# Patient Record
Sex: Female | Born: 1985 | Race: Black or African American | Hispanic: No | Smoking: Never smoker
Health system: Southern US, Community
[De-identification: ages and names within clinical notes are randomized; demographics above are authoritative.]

## PROBLEM LIST (undated history)

## (undated) DIAGNOSIS — N83209 Unspecified ovarian cyst, unspecified side: Secondary | ICD-10-CM

## (undated) DIAGNOSIS — N840 Polyp of corpus uteri: Secondary | ICD-10-CM

## (undated) DIAGNOSIS — T7840XA Allergy, unspecified, initial encounter: Secondary | ICD-10-CM

## (undated) DIAGNOSIS — D649 Anemia, unspecified: Secondary | ICD-10-CM

## (undated) DIAGNOSIS — Z5189 Encounter for other specified aftercare: Secondary | ICD-10-CM

## (undated) DIAGNOSIS — N809 Endometriosis, unspecified: Secondary | ICD-10-CM

## (undated) DIAGNOSIS — F419 Anxiety disorder, unspecified: Secondary | ICD-10-CM

## (undated) HISTORY — DX: Allergy, unspecified, initial encounter: T78.40XA

## (undated) HISTORY — DX: Polyp of corpus uteri: N84.0

## (undated) HISTORY — DX: Encounter for other specified aftercare: Z51.89

## (undated) HISTORY — DX: Anxiety disorder, unspecified: F41.9

## (undated) HISTORY — PX: OTHER SURGICAL HISTORY: SHX169

## (undated) HISTORY — PX: LAPAROSCOPIC OVARIAN CYSTECTOMY: SUR786

---
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
## 2007-03-04 ENCOUNTER — Observation Stay (HOSPITAL_COMMUNITY): Admission: AD | Admit: 2007-03-04 | Discharge: 2007-03-05 | Payer: Self-pay | Admitting: Internal Medicine

## 2009-07-08 ENCOUNTER — Emergency Department (HOSPITAL_COMMUNITY): Admission: EM | Admit: 2009-07-08 | Discharge: 2009-07-08 | Payer: Self-pay | Admitting: Emergency Medicine

## 2010-09-16 NOTE — Discharge Summary (Signed)
Margaret Roach, [place]            ACCOUNT NO.:  [id number]   MEDICAL RECORD NO.:  [id number]          PATIENT TYPE:  OBV   LOCATION:  1345                         FACILITY:  [facility]   PHYSICIAN:  Jackie Plum, M.D.DATE OF BIRTH:  09/24/1985   DATE OF ADMISSION:  03/04/2007  DATE OF DISCHARGE:  03/05/2007                               DISCHARGE SUMMARY   DISCHARGE DIAGNOSIS:  Dysfunctional uterine bleeding with blood loss  anemia/iron deficiency anemia, symptomatic, status post three units  packed red blood cell transfusion this admission, improved.   DISCHARGE MEDICATIONS:  Patient is going to continue her iron 150 mg  b.i.d.   FOLLOWUP APPOINTMENT:  Patient has a followup with Dr. Julio Sicks in the  last week of November and Dr. Neva Seat , of Gynecology, on March 16, 2007.   CONDITION ON DISCHARGE:  Improved, satisfactory.   REASON FOR ADMISSION:  Symptomatic severe anemia.   HPI:  Patient is a 25 year old lady with a history of dysfunctional  uterine bleeding who was seen in the office and was started on iron and  Provera and given an appointment to follow up with Gynecology.  However,  she continued to complain of dizziness with dyspnea on exertion which  was symptomatic of her severe anemia with hemoglobin value of 7.4.  She  was therefore admitted for blood transfusion.  She received a blood  transfusion at [facility] over night.  She is feeling better  this morning, she is hemodynamically stable, her symptoms have resolved.  No more dizziness or weakness.  No dyspnea on exertion and she has a  hemoglobin more than 10 and she is being discharged today with  outpatient followup by Dr. Neva Seat, who has already got an appointment  for her to be seen on November the 12th.      Jackie Plum, M.D.  Electronically Signed     GO/MEDQ  D:  03/05/2007  T:  03/06/2007  Job:  [id number]
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
## 2011-02-10 LAB — CBC
HCT: 38.1
Hemoglobin: 12.4
MCHC: 32.6
Platelets: 460 — ABNORMAL HIGH
RBC: 5.42 — ABNORMAL HIGH
RDW: 27.9 — ABNORMAL HIGH
WBC: 7.7

## 2011-02-10 LAB — BASIC METABOLIC PANEL
BUN: 5 — ABNORMAL LOW
Calcium: 9.7
Chloride: 101
Potassium: 3.9

## 2011-02-11 LAB — ABO/RH: ABO/RH(D): B POS

## 2011-02-11 LAB — CROSSMATCH: ABO/RH(D): B POS

## 2013-02-24 ENCOUNTER — Encounter (HOSPITAL_BASED_OUTPATIENT_CLINIC_OR_DEPARTMENT_OTHER): Payer: Self-pay | Admitting: Emergency Medicine

## 2013-02-24 ENCOUNTER — Emergency Department (HOSPITAL_BASED_OUTPATIENT_CLINIC_OR_DEPARTMENT_OTHER)
Admission: EM | Admit: 2013-02-24 | Discharge: 2013-02-24 | Disposition: A | Discharge status: Home / Self Care | Payer: 59 | Attending: Emergency Medicine | Admitting: Emergency Medicine

## 2013-02-24 DIAGNOSIS — Z8742 Personal history of other diseases of the female genital tract: Secondary | ICD-10-CM | POA: Insufficient documentation

## 2013-02-24 DIAGNOSIS — L509 Urticaria, unspecified: Secondary | ICD-10-CM | POA: Insufficient documentation

## 2013-02-24 HISTORY — DX: Unspecified ovarian cyst, unspecified side: N83.209

## 2013-02-24 HISTORY — DX: Endometriosis, unspecified: N80.9

## 2013-02-24 MED ORDER — DEXAMETHASONE 4 MG PO TABS
10.0000 mg | ORAL_TABLET | Freq: Once | ORAL | Status: AC
  Administered 2013-02-24: 10 mg via ORAL

## 2013-02-24 MED ORDER — FAMOTIDINE 20 MG PO TABS
20.0000 mg | ORAL_TABLET | Freq: Once | ORAL | Status: AC
  Administered 2013-02-24: 20 mg via ORAL
  Filled 2013-02-24: qty 1

## 2013-02-24 MED ORDER — DEXAMETHASONE 4 MG PO TABS
ORAL_TABLET | ORAL | Status: AC
  Filled 2013-02-24: qty 3

## 2013-02-24 MED ORDER — DIPHENHYDRAMINE HCL 25 MG PO CAPS
50.0000 mg | ORAL_CAPSULE | Freq: Once | ORAL | Status: AC
  Administered 2013-02-24: 50 mg via ORAL
  Filled 2013-02-24: qty 2

## 2013-02-24 MED ORDER — FAMOTIDINE 20 MG PO TABS: 20.0000 mg | ORAL_TABLET | Freq: Two times a day (BID) | ORAL | Status: DC | PRN

## 2013-02-24 MED ORDER — DIPHENHYDRAMINE HCL 25 MG PO TABS: 50.0000 mg | ORAL_TABLET | Freq: Four times a day (QID) | ORAL | Status: DC | PRN

## 2013-02-24 NOTE — ED Notes (Signed)
Pt given rx x 2 for benadryl and pepcid

## 2013-02-24 NOTE — ED Provider Notes (Signed)
CSN: [id number]     Arrival date & time 02/24/13  0315 History   First MD Initiated Contact with Patient 02/24/13 0329     Chief Complaint  Patient presents with  . Allergic Reaction   (Consider location/radiation/quality/duration/timing/severity/associated sxs/prior Treatment) HPI This is a 27 year old female who developed what she describes as hives on her lower legs 2 days ago. Over the past several hours she has developed diffuse hives. She states they're extremely itchy. She has been scratching them, particularly the ones on her legs. She denies throat swelling, shortness of breath, nausea, vomiting or diarrhea. She has not taken anything for these. She is not sure what may have triggered this. She did use a new body wash this week but that was after the lesions on her legs developed.  Past Medical History  Diagnosis Date  . Endometriosis   . Ovarian cyst    Past Surgical History  Procedure Laterality Date  . Laparoscopic ovarian cystectomy     No family history on file. History  Substance Use Topics  . Smoking status: Never Smoker   . Smokeless tobacco: Never Used  . Alcohol Use: Yes     Comment: social   OB History   Grav Para Term Preterm Abortions TAB SAB Ect Mult Living                 Review of Systems  All other systems reviewed and are negative.    Allergies  Review of patient's allergies indicates no known allergies.  Home Medications  No current outpatient prescriptions on file. BP 129/80  Pulse 66  Temp(Src) 97.8 F (36.6 C) (Oral)  Resp 20  Ht 5\' 3"  (1.6 m)  Wt 172 lb (78.019 kg)  BMI 30.48 kg/m2  SpO2 100%  LMP 02/06/2013  Physical Exam General: Well-developed, well-nourished female in no acute distress; appearance consistent with age of record HENT: normocephalic; atraumatic Eyes: pupils equal, round and reactive to light; extraocular muscles intact Neck: supple Heart: regular rate and rhythm Lungs: clear to auscultation  bilaterally Abdomen: soft; nondistended; nontender Extremities: No deformity; full range of motion Neurologic: Awake, alert and oriented; motor function intact in all extremities and symmetric; no facial droop Skin: Warm and dry; scattered diffuse urticarial lesions, both lower leg showing evidence of scratching Psychiatric: Normal mood and affect    ED Course  Procedures (including critical care time)    MDM      Hanley Seamen, MD 02/24/13 0340

## 2013-02-24 NOTE — ED Notes (Signed)
Pt reports using new body wash this week- noticed rash on legs yesterday- tonight at work noted hives to chest- denies SOB

## 2016-11-19 ENCOUNTER — Emergency Department (HOSPITAL_BASED_OUTPATIENT_CLINIC_OR_DEPARTMENT_OTHER): Payer: Self-pay

## 2016-11-19 ENCOUNTER — Encounter (HOSPITAL_BASED_OUTPATIENT_CLINIC_OR_DEPARTMENT_OTHER): Payer: Self-pay

## 2016-11-19 ENCOUNTER — Emergency Department (HOSPITAL_BASED_OUTPATIENT_CLINIC_OR_DEPARTMENT_OTHER)
Admission: EM | Admit: 2016-11-19 | Discharge: 2016-11-20 | Disposition: A | Discharge status: Home / Self Care | Payer: Self-pay | Attending: Emergency Medicine | Admitting: Emergency Medicine

## 2016-11-19 DIAGNOSIS — D649 Anemia, unspecified: Secondary | ICD-10-CM | POA: Insufficient documentation

## 2016-11-19 DIAGNOSIS — Z79899 Other long term (current) drug therapy: Secondary | ICD-10-CM | POA: Insufficient documentation

## 2016-11-19 DIAGNOSIS — R102 Pelvic and perineal pain: Secondary | ICD-10-CM | POA: Insufficient documentation

## 2016-11-19 DIAGNOSIS — N72 Inflammatory disease of cervix uteri: Secondary | ICD-10-CM | POA: Insufficient documentation

## 2016-11-19 DIAGNOSIS — D509 Iron deficiency anemia, unspecified: Secondary | ICD-10-CM

## 2016-11-19 HISTORY — DX: Anemia, unspecified: D64.9

## 2016-11-19 LAB — CBC WITH DIFFERENTIAL/PLATELET
Basophils Absolute: 0 10*3/uL (ref 0.0–0.1)
Basophils Relative: 0 %
Eosinophils Absolute: 0.1 10*3/uL (ref 0.0–0.7)
Eosinophils Relative: 1 %
HEMATOCRIT: 31.6 % — AB (ref 36.0–46.0)
HEMOGLOBIN: 10.5 g/dL — AB (ref 12.0–15.0)
LYMPHS ABS: 1.4 10*3/uL (ref 0.7–4.0)
LYMPHS PCT: 11 %
MCH: 24.2 pg — AB (ref 26.0–34.0)
MCHC: 33.2 g/dL (ref 30.0–36.0)
MCV: 72.8 fL — AB (ref 78.0–100.0)
MONOS PCT: 6 %
Monocytes Absolute: 0.8 10*3/uL (ref 0.1–1.0)
NEUTROS ABS: 10.2 10*3/uL — AB (ref 1.7–7.7)
NEUTROS PCT: 82 %
Platelets: 246 10*3/uL (ref 150–400)
RBC: 4.34 MIL/uL (ref 3.87–5.11)
RDW: 17.3 % — ABNORMAL HIGH (ref 11.5–15.5)
WBC: 12.4 10*3/uL — ABNORMAL HIGH (ref 4.0–10.5)

## 2016-11-19 LAB — URINALYSIS, ROUTINE W REFLEX MICROSCOPIC
Bilirubin Urine: NEGATIVE
GLUCOSE, UA: NEGATIVE mg/dL
HGB URINE DIPSTICK: NEGATIVE
KETONES UR: NEGATIVE mg/dL
Nitrite: NEGATIVE
PROTEIN: NEGATIVE mg/dL
Specific Gravity, Urine: 1.012 (ref 1.005–1.030)
pH: 8 (ref 5.0–8.0)

## 2016-11-19 LAB — URINALYSIS, MICROSCOPIC (REFLEX): RBC / HPF: NONE SEEN RBC/hpf (ref 0–5)

## 2016-11-19 LAB — WET PREP, GENITAL
SPERM: NONE SEEN
TRICH WET PREP: NONE SEEN
Yeast Wet Prep HPF POC: NONE SEEN

## 2016-11-19 LAB — PREGNANCY, URINE: PREG TEST UR: NEGATIVE

## 2016-11-19 MED ORDER — ONDANSETRON 8 MG PO TBDP
ORAL_TABLET | ORAL | Status: AC
  Filled 2016-11-19: qty 1

## 2016-11-19 MED ORDER — ONDANSETRON 8 MG PO TBDP
8.0000 mg | ORAL_TABLET | Freq: Once | ORAL | Status: AC
  Administered 2016-11-19: 8 mg via ORAL

## 2016-11-19 NOTE — ED Notes (Signed)
Patient transported to Ultrasound 

## 2016-11-19 NOTE — ED Provider Notes (Signed)
[facility] Provider Note   CSN: [id number] Arrival date & time: 11/19/16  1908  By signing my name below, I, Collene Leyden, attest that this documentation has been prepared under the direction and in the presence of Delora Fuel, MD. Electronically Signed: Collene Leyden, Scribe. 11/19/16. 8:28 PM.  History   Chief Complaint Chief Complaint  Patient presents with  . Vaginal Bleeding    HPI Comments: Margaret Roach is a 31 y.o. female with a history of an ovarian cyst and endometriosis, who presents to the Emergency Department complaining of persistent vaginal bleeding that began 9 days ago. Patient reports vaginal bleeding for several days, which progressively worsening 2 days ago. Patient does have a mirena intrauterine device in place (placed 5 years ago). Patient states she is currently on her period, but her menstrual period usually only lasts 7 days and is not as heavy as her current bleeding. Patient reports having to use 2 tampons within a 10 minute time frame.  Patient states she does have a doctors appointment scheduled in one month. Patient reports associated 8/10 abdominal cramping and mild nausea. Patient reports taking ibuprofen without relief. Patient denies any fever, chills, dysuria, hematuria, vaginal discharge, or any additional symptoms.   The history is provided by the patient. No language interpreter was used.    Past Medical History:  Diagnosis Date  . Anemia   . Endometriosis   . Ovarian cyst     There are no active problems to display for this patient.   Past Surgical History:  Procedure Laterality Date  . LAPAROSCOPIC OVARIAN CYSTECTOMY      OB History    No data available       Home Medications    Prior to Admission medications   Medication Sig Start Date End Date Taking? Authorizing Provider  phentermine 37.5 MG capsule Take 37.5 mg by mouth every morning.   Yes [provider]    Family History No family history on  file.  Social History Social History  Substance Use Topics  . Smoking status: Never Smoker  . Smokeless tobacco: Never Used  . Alcohol use Yes     Comment: occ     Allergies   Patient has no known allergies.   Review of Systems Review of Systems  Constitutional: Negative for chills and fever.  Gastrointestinal: Positive for abdominal pain and nausea. Negative for diarrhea and vomiting.  Genitourinary: Positive for vaginal bleeding. Negative for difficulty urinating, dysuria, hematuria and vaginal discharge.  All other systems reviewed and are negative.    Physical Exam Updated Vital Signs BP (!) 136/93 (BP Location: Right Arm)   Pulse 90   Temp 97.8 F (36.6 C) (Oral)   Resp 19   Ht 5\' 3"  (1.6 m)   Wt 168 lb 4.8 oz (76.3 kg)   LMP 11/10/2016   SpO2 100%   BMI 29.81 kg/m   Physical Exam  Constitutional: She is oriented to person, place, and time. She appears well-developed and well-nourished.  HENT:  Head: Normocephalic and atraumatic.  Eyes: Pupils are equal, round, and reactive to light. EOM are normal.  Neck: Normal range of motion. Neck supple. No JVD present.  Cardiovascular: Normal rate, regular rhythm and normal heart sounds.   No murmur heard. Pulmonary/Chest: Effort normal and breath sounds normal. She has no wheezes. She has no rales. She exhibits no tenderness.  Abdominal: Soft. Bowel sounds are normal. She exhibits no distension and no mass. There is no tenderness.  Genitourinary:  Genitourinary Comments: Normal external female genitalia. Moderate amount of white discharge present, no blood in the vaginal vault. Cervix has numerous cysts and is generally friable. There are no adnexal masses or tenderness. Fundus is normal size and position. Marked tenderness on palpation of the cervix.  Musculoskeletal: Normal range of motion. She exhibits no edema.  Lymphadenopathy:    She has no cervical adenopathy.  Neurological: She is alert and oriented to person,  place, and time. No cranial nerve deficit. She exhibits normal muscle tone. Coordination normal.  Skin: Skin is warm and dry. No rash noted.  Psychiatric: She has a normal mood and affect. Her behavior is normal. Judgment and thought content normal.  Nursing note and vitals reviewed.    ED Treatments / Results  DIAGNOSTIC STUDIES: Oxygen Saturation is 100% on RA, normal by my interpretation.    COORDINATION OF CARE: 8:27 PM Discussed treatment plan with pt at bedside and pt agreed to plan, which includes blood work and an STD check.   Labs (all labs ordered are listed, but only abnormal results are displayed) Labs Reviewed  WET PREP, GENITAL - Abnormal; Notable for the following:       Result Value   Clue Cells Wet Prep HPF POC PRESENT (*)    WBC, Wet Prep HPF POC MANY (*)    All other components within normal limits  URINALYSIS, ROUTINE W REFLEX MICROSCOPIC - Abnormal; Notable for the following:    Leukocytes, UA MODERATE (*)    All other components within normal limits  URINALYSIS, MICROSCOPIC (REFLEX) - Abnormal; Notable for the following:    Bacteria, UA RARE (*)    Squamous Epithelial / LPF 0-5 (*)    All other components within normal limits  CBC WITH DIFFERENTIAL/PLATELET - Abnormal; Notable for the following:    WBC 12.4 (*)    Hemoglobin 10.5 (*)    HCT 31.6 (*)    MCV 72.8 (*)    MCH 24.2 (*)    RDW 17.3 (*)    Neutro Abs 10.2 (*)    All other components within normal limits  PREGNANCY, URINE  RPR  HIV ANTIBODY (ROUTINE TESTING)  GC/CHLAMYDIA PROBE AMP ([place]) NOT AT [facility]    Radiology US Transvaginal Non-ob  Result Date: 11/19/2016 CLINICAL DATA:  History of endometriosis. Vaginal bleeding and pelvic pain for 9 days. EXAM: TRANSABDOMINAL AND TRANSVAGINAL ULTRASOUND OF PELVIS TECHNIQUE: Both transabdominal and transvaginal ultrasound examinations of the pelvis were performed. Transabdominal technique was performed for global imaging of the pelvis  including uterus, ovaries, adnexal regions, and pelvic cul-de-sac. It was necessary to proceed with endovaginal exam following the transabdominal exam to visualize the ovaries and uterus. COMPARISON:  None FINDINGS: Uterus Measurements: 10.4 x 4.8 x 7.1 cm. Benign appearing calcifications in the anterior uterine body. Otherwise normal. No fibroids or other mass. There are nabothian cysts at the cervix. Endometrium Thickness: 5.8 mm. There is no intrauterine contraceptive device in the endometrial cavity. Right ovary Measurements: 5.4 x 3.3 x 4.0 cm. There are multiple structures with homogeneous low-level internal abscess without central vascularity. The largest of these measures 2.6 x 1.7 cm. Left ovary Measurements: 5.8 x 2.9 x 2.6 cm. There are similar structures in the left adnexa that measure up to 1.9 cm. Other findings No abnormal free fluid. IMPRESSION: 1. No intrauterine contraceptive device. If it cannot be confirmed that the device was removed clinically, an abdominal radiograph is recommended to exclude the possibility that the device has migrated  into the abdominal cavity. 2. Multiple bilateral adnexal lesions with homogeneous low level internal echoes, most consistent with endometriomas. Electronically Signed   By: Ulyses Jarred M.D.   On: 11/19/2016 23:11   US Pelvis Complete  Result Date: 11/19/2016 CLINICAL DATA:  History of endometriosis. Vaginal bleeding and pelvic pain for 9 days. EXAM: TRANSABDOMINAL AND TRANSVAGINAL ULTRASOUND OF PELVIS TECHNIQUE: Both transabdominal and transvaginal ultrasound examinations of the pelvis were performed. Transabdominal technique was performed for global imaging of the pelvis including uterus, ovaries, adnexal regions, and pelvic cul-de-sac. It was necessary to proceed with endovaginal exam following the transabdominal exam to visualize the ovaries and uterus. COMPARISON:  None FINDINGS: Uterus Measurements: 10.4 x 4.8 x 7.1 cm. Benign appearing  calcifications in the anterior uterine body. Otherwise normal. No fibroids or other mass. There are nabothian cysts at the cervix. Endometrium Thickness: 5.8 mm. There is no intrauterine contraceptive device in the endometrial cavity. Right ovary Measurements: 5.4 x 3.3 x 4.0 cm. There are multiple structures with homogeneous low-level internal abscess without central vascularity. The largest of these measures 2.6 x 1.7 cm. Left ovary Measurements: 5.8 x 2.9 x 2.6 cm. There are similar structures in the left adnexa that measure up to 1.9 cm. Other findings No abnormal free fluid. IMPRESSION: 1. No intrauterine contraceptive device. If it cannot be confirmed that the device was removed clinically, an abdominal radiograph is recommended to exclude the possibility that the device has migrated into the abdominal cavity. 2. Multiple bilateral adnexal lesions with homogeneous low level internal echoes, most consistent with endometriomas. Electronically Signed   By: Ulyses Jarred M.D.   On: 11/19/2016 23:11    Procedures Procedures (including critical care time)  Medications Ordered in ED Medications  ondansetron (ZOFRAN-ODT) 8 MG disintegrating tablet (not administered)  cefTRIAXone (ROCEPHIN) injection 250 mg (not administered)  doxycycline (VIBRA-TABS) tablet 100 mg (not administered)  azithromycin (ZITHROMAX) powder 1 g (not administered)  metroNIDAZOLE (FLAGYL) tablet 500 mg (not administered)  lidocaine (PF) (XYLOCAINE) 1 % injection (not administered)  ondansetron (ZOFRAN-ODT) disintegrating tablet 8 mg (8 mg Oral Given 11/19/16 2029)     Initial Impression / Assessment and Plan / ED Course  I have reviewed the triage vital signs and the nursing notes.  Pertinent labs & imaging results that were available during my care of the patient were reviewed by me and considered in my medical decision making (see chart for details).  Report of vaginal bleeding, but no actual bleeding noted on exam.  Cervical friability and tenderness is noted. She is sent for pelvic ultrasound which showed nabothian cysts and endometriomas, but no IUD. KUB was obtained looking for IUD and it was not visible per my reading, official radiology interpretation pending. Wet prep was positive for clue cells and many WBCs. At this point, it was felt most appropriate to treat for possible PID. She is given a dose of ceftriaxone, azithromycin, metronidazole, and doxycycline. She sent home with prescriptions for doxycycline and metronidazole. Advised follow-up with her gynecologist following completion of antibiotics. Told to use over-the-counter analgesics as needed for pain. Advised of the need for other means of contraception.  Final Clinical Impressions(s) / ED Diagnoses   Final diagnoses:  Pelvic pain in female  Cervicitis  Microcytic anemia    New Prescriptions New Prescriptions   DOXYCYCLINE (VIBRAMYCIN) 100 MG CAPSULE    Take 1 capsule (100 mg total) by mouth 2 (two) times daily.   METRONIDAZOLE (FLAGYL) 500 MG TABLET    Take 1  tablet (500 mg total) by mouth 2 (two) times daily.   I personally performed the services described in this documentation, which was scribed in my presence. The recorded information has been reviewed and is accurate.       Delora Fuel, MD 15/05/69 [phone]

## 2016-11-19 NOTE — ED Triage Notes (Signed)
C/o heavy vaginal bleeding x 9 days-states this is her 2nd abnormally heavy period-c/o lower abd pain-NAD-steady gait

## 2016-11-20 ENCOUNTER — Inpatient Hospital Stay (HOSPITAL_COMMUNITY)
Admission: AD | Admit: 2016-11-20 | Discharge: 2016-11-20 | Disposition: A | Discharge status: Home / Self Care | Payer: Self-pay | Source: Ambulatory Visit | Attending: Obstetrics & Gynecology | Admitting: Obstetrics & Gynecology

## 2016-11-20 ENCOUNTER — Encounter (HOSPITAL_COMMUNITY): Payer: Self-pay | Admitting: *Deleted

## 2016-11-20 DIAGNOSIS — Z3202 Encounter for pregnancy test, result negative: Secondary | ICD-10-CM | POA: Insufficient documentation

## 2016-11-20 DIAGNOSIS — A549 Gonococcal infection, unspecified: Secondary | ICD-10-CM

## 2016-11-20 DIAGNOSIS — A5402 Gonococcal vulvovaginitis, unspecified: Secondary | ICD-10-CM | POA: Insufficient documentation

## 2016-11-20 DIAGNOSIS — R102 Pelvic and perineal pain: Secondary | ICD-10-CM

## 2016-11-20 LAB — POCT PREGNANCY, URINE: Preg Test, Ur: NEGATIVE

## 2016-11-20 LAB — URINALYSIS, ROUTINE W REFLEX MICROSCOPIC
BILIRUBIN URINE: NEGATIVE
GLUCOSE, UA: NEGATIVE mg/dL
KETONES UR: NEGATIVE mg/dL
NITRITE: NEGATIVE
PH: 5 (ref 5.0–8.0)
PROTEIN: NEGATIVE mg/dL
Specific Gravity, Urine: 1.006 (ref 1.005–1.030)

## 2016-11-20 LAB — GC/CHLAMYDIA PROBE AMP (~~LOC~~) NOT AT ARMC
CHLAMYDIA, DNA PROBE: NEGATIVE
NEISSERIA GONORRHEA: POSITIVE — AB

## 2016-11-20 MED ORDER — AZITHROMYCIN 1 G PO PACK
1.0000 g | PACK | Freq: Once | ORAL | Status: AC
  Administered 2016-11-20: 1 g via ORAL
  Filled 2016-11-20: qty 1

## 2016-11-20 MED ORDER — METRONIDAZOLE 500 MG PO TABS: 500.0000 mg | ORAL_TABLET | Freq: Two times a day (BID) | ORAL | 0 refills | Status: DC

## 2016-11-20 MED ORDER — LIDOCAINE HCL (PF) 1 % IJ SOLN
INTRAMUSCULAR | Status: AC
  Filled 2016-11-20: qty 5

## 2016-11-20 MED ORDER — DOXYCYCLINE HYCLATE 100 MG PO CAPS: 100.0000 mg | ORAL_CAPSULE | Freq: Two times a day (BID) | ORAL | 0 refills | Status: DC

## 2016-11-20 MED ORDER — DOXYCYCLINE HYCLATE 100 MG PO TABS
100.0000 mg | ORAL_TABLET | Freq: Once | ORAL | Status: AC
  Administered 2016-11-20: 100 mg via ORAL
  Filled 2016-11-20: qty 1

## 2016-11-20 MED ORDER — CEFTRIAXONE SODIUM 250 MG IJ SOLR
250.0000 mg | Freq: Once | INTRAMUSCULAR | Status: AC
  Administered 2016-11-20: 250 mg via INTRAMUSCULAR
  Filled 2016-11-20: qty 250

## 2016-11-20 MED ORDER — METRONIDAZOLE 500 MG PO TABS
500.0000 mg | ORAL_TABLET | Freq: Once | ORAL | Status: AC
  Administered 2016-11-20: 500 mg via ORAL
  Filled 2016-11-20: qty 1

## 2016-11-20 NOTE — MAU Provider Note (Signed)
History     CSN: [id number]  Arrival date and time: 11/20/16 2006  First Provider Initiated Contact with Patient 11/20/16 2120     Chief Complaint  Patient presents with  . Abdominal Pain  . Fever   HPI Margaret Roach is a 31 y.o. G1P1001 non pregnant female who presents with lower abdominal pain and fever. She was seen at [facility] last night, treated for PID and told to follow up with her OB/GYN. She reports fevers that started this morning, highest was 101 degrees, and she has not taken anything for them. Afebrile in MAU. She also reports pain in her lower abdomen that she rates a 6/10 and has not taken any pain medicine. She states the pain is the same as the pain she was having last night.   OB History    Gravida Para Term Preterm AB Living   1 1 1     1    SAB TAB Ectopic Multiple Live Births           1      Past Medical History:  Diagnosis Date  . Anemia   . Endometriosis   . Ovarian cyst     Past Surgical History:  Procedure Laterality Date  . LAPAROSCOPIC OVARIAN CYSTECTOMY      Family History  Problem Relation Age of Onset  . Adopted: Yes  . Cancer Maternal Grandmother     Social History  Substance Use Topics  . Smoking status: Never Smoker  . Smokeless tobacco: Never Used  . Alcohol use Yes     Comment: occ    Allergies:  Allergies  Allergen Reactions  . Adhesive [Tape] Rash  . Latex Hives and Rash    Prescriptions Prior to Admission  Medication Sig Dispense Refill Last Dose  . Omega-3 Fatty Acids (FISH OIL) 1000 MG CAPS Take 1 capsule by mouth daily.   11/13/2016  . phentermine 37.5 MG capsule Take 37.5 mg by mouth every morning.   11/20/2016 at Unknown time  . doxycycline (VIBRAMYCIN) 100 MG capsule Take 1 capsule (100 mg total) by mouth 2 (two) times daily. (Patient not taking: Reported on 11/20/2016) 20 capsule 0 Not Taking at Unknown time  . metroNIDAZOLE (FLAGYL) 500 MG tablet Take 1 tablet (500 mg total) by mouth 2 (two)  times daily. (Patient not taking: Reported on 11/20/2016) 20 tablet 0 Not Taking at Unknown time    Review of Systems  Constitutional: Positive for fever. Negative for chills.  HENT: Negative.   Respiratory: Negative.  Negative for shortness of breath.   Cardiovascular: Negative.  Negative for chest pain.  Gastrointestinal: Positive for abdominal pain. Negative for constipation, diarrhea, nausea and vomiting.  Genitourinary: Negative.  Negative for dysuria, vaginal bleeding and vaginal discharge.  Neurological: Negative.  Negative for dizziness and headaches.  Psychiatric/Behavioral: Negative.    Physical Exam   Blood pressure 127/79, pulse (!) 116, temperature 99.9 F (37.7 C), resp. rate 15, height 5\' 3"  (1.6 m), weight 169 lb (76.7 kg), last menstrual period 11/10/2016, SpO2 100 %.  Physical Exam  Nursing note and vitals reviewed. Constitutional: She appears well-developed and well-nourished.  HENT:  Head: Normocephalic and atraumatic.  Eyes: Conjunctivae are normal. No scleral icterus.  Cardiovascular: Normal rate, regular rhythm and normal heart sounds.   Respiratory: Effort normal and breath sounds normal. No respiratory distress.  GI: Soft. She exhibits no distension. There is tenderness. There is no guarding.  Neurological: She is alert.  Skin: Skin is  warm and dry.  Psychiatric: She has a normal mood and affect. Her behavior is normal. Judgment and thought content normal.   Patient refused pelvic/bimanual exam.  MAU Course  Procedures Results for orders placed or performed during the hospital encounter of 11/20/16 (from the past 24 hour(s))  Urinalysis, Routine w reflex microscopic     Status: Abnormal   Collection Time: 11/20/16  8:35 PM  Result Value Ref Range   Color, Urine YELLOW YELLOW   APPearance HAZY (A) CLEAR   Specific Gravity, Urine 1.006 1.005 - 1.030   pH 5.0 5.0 - 8.0   Glucose, UA NEGATIVE NEGATIVE mg/dL   Hgb urine dipstick MODERATE (A) NEGATIVE    Bilirubin Urine NEGATIVE NEGATIVE   Ketones, ur NEGATIVE NEGATIVE mg/dL   Protein, ur NEGATIVE NEGATIVE mg/dL   Nitrite NEGATIVE NEGATIVE   Leukocytes, UA LARGE (A) NEGATIVE   RBC / HPF 0-5 0 - 5 RBC/hpf   WBC, UA 6-30 0 - 5 WBC/hpf   Bacteria, UA RARE (A) NONE SEEN   Squamous Epithelial / LPF 6-30 (A) NONE SEEN   Mucous PRESENT   Pregnancy, urine POC     Status: None   Collection Time: 11/20/16  8:38 PM  Result Value Ref Range   Preg Test, Ur NEGATIVE NEGATIVE   MDM UA, UPT Urine culture Patient informed of positive gonorrhea result. Patient understandably upset, declined any further evaluation stating "everything was done last night. I know the reason for my pain."  Assessment and Plan   1. Gonorrhea in female   2. Pelvic pain    -Discharge patient home in stable condition -Encouraged patient to fill prescriptions for doxycycline and metronidazole as prescribed and start taking ASAP -Follow up in [facility] in 2 weeks -Encouraged to return here or to other Urgent Care/ED if she develops worsening of symptoms, increase in pain, fever, or other concerning symptoms.   Len Blalock SNM 11/20/2016, 9:41 PM   I confirm that I have verified the information documented in the nurse midwife student's note and that I have also personally reperformed the physical exam and all medical decision making activities.   Marcille Buffy 10:53 PM 11/20/16

## 2016-11-20 NOTE — Progress Notes (Signed)
Marcille Buffy CNM in earlier to discuss d/c plan with pt. Written and verbal d/c instructions given and understanding voiced.

## 2016-11-20 NOTE — Progress Notes (Addendum)
Marcille Buffy CNM on unit and aware of pt's admission and status. Will see pt once in room.

## 2016-11-20 NOTE — MAU Note (Addendum)
Having sharp, dull, achy pain in lower abd since WEds night. Seen at [facility] yesterday and started on antibiotics. Could not find IUD yesterday. Had pelvic and abd u/s and xray. Saw cysts on both ovaries and was told has endometriosis. Fever of 101-103 about 1100. Has not taken anything for fever. No chills. When urinates or pushes to have BM hurts in lower abd.

## 2016-11-20 NOTE — Discharge Instructions (Signed)
Your IUD is not in place - you will need to use other means of birth control.  You may take ibuprofen, naproxen or acetaminophen as needed for pain.  Return if symptoms are getting worse.

## 2016-11-20 NOTE — Discharge Instructions (Signed)
Pelvic Inflammatory Disease Pelvic inflammatory disease (PID) refers to an infection in some or all of the female organs. The infection can be in the uterus, ovaries, fallopian tubes, or the surrounding tissues in the pelvis. PID can cause abdominal or pelvic pain that comes on suddenly (acute pelvic pain). PID is a serious infection because it can lead to lasting (chronic) pelvic pain or the inability to have children (infertility). What are the causes? This condition is most often caused by an infection that is spread during sexual contact. However, the infection can also be caused by the normal bacteria that are found in the vaginal tissues if these bacteria travel upward into the reproductive organs. PID can also occur following:  The birth of a baby.  A miscarriage.  An abortion.  Major pelvic surgery.  The use of an intrauterine device (IUD).  A sexual assault.  What increases the risk? This condition is more likely to develop in women who:  Are younger than 31 years of age.  Are sexually active at [facility] age.  Use nonbarrier contraception.  Have multiple sexual partners.  Have sex with someone who has symptoms of an STD (sexually transmitted disease).  Use oral contraception.  At times, certain behaviors can also increase the possibility of getting PID, such as:  Using a vaginal douche.  Having an IUD in place.  What are the signs or symptoms? Symptoms of this condition include:  Abdominal or pelvic pain.  Fever.  Chills.  Abnormal vaginal discharge.  Abnormal uterine bleeding.  Unusual pain shortly after the end of a menstrual period.  Painful urination.  Pain with sexual intercourse.  Nausea and vomiting.  How is this diagnosed? To diagnose this condition, your health care provider will do a physical exam and take your medical history. A pelvic exam typically reveals great tenderness in the uterus and the surrounding pelvic tissues. You may also  have tests, such as:  Lab tests, including a pregnancy test, blood tests, and urine test.  Culture tests of the vagina and cervix to check for an STD.  Ultrasound.  A laparoscopic procedure to look inside the pelvis.  Examining vaginal secretions under a microscope.  How is this treated? Treatment for this condition may involve one or more approaches.  Antibiotic medicines may be prescribed to be taken by mouth.  Sexual partners may need to be treated if the infection is caused by an STD.  For more severe cases, hospitalization may be needed to give antibiotics directly into a vein through an IV tube.  Surgery may be needed if other treatments do not help, but this is rare.  It may take weeks until you are completely well. If you are diagnosed with PID, you should also be checked for human immunodeficiency virus (HIV). Your health care provider may test you for infection again 3 months after treatment. You should not have unprotected sex. Follow these instructions at home:  Take over-the-counter and prescription medicines only as told by your health care provider.  If you were prescribed an antibiotic medicine, take it as told by your health care provider. Do not stop taking the antibiotic even if you start to feel better.  Do not have sexual intercourse until treatment is completed or as told by your health care provider. If PID is confirmed, your recent sexual partners will need treatment, especially if you had unprotected sex.  Keep all follow-up visits as told by your health care provider. This is important. Contact a health care  provider if:  You have increased or abnormal vaginal discharge.  Your pain does not improve.  You vomit.  You have a fever.  You cannot tolerate your medicines.  Your partner has an STD.  You have pain when you urinate. Get help right away if:  You have increased abdominal or pelvic pain.  You have chills.  Your symptoms are not  better in 72 hours even with treatment. This information is not intended to replace advice given to you by your health care provider. Make sure you discuss any questions you have with your health care provider. Document Released: 04/20/2005 Document Revised: 09/26/2015 Document Reviewed: 05/28/2014 Elsevier Interactive Patient Education  2018 [place]. Pelvic Pain, Female Pelvic pain is pain in your lower abdomen, below your belly button and between your hips. The pain may start suddenly (acute), keep coming back (recurring), or last a long time (chronic). Pelvic pain that lasts longer than six months is considered chronic. Pelvic pain may affect your:  Reproductive organs.  Urinary system.  Digestive tract.  Musculoskeletal system.  There are many potential causes of pelvic pain. Sometimes, the pain can be a result of digestive or urinary conditions, strained muscles or ligaments, or even reproductive conditions. Sometimes the cause of pelvic pain is not known. Follow these instructions at home:  Take over-the-counter and prescription medicines only as told by your health care provider.  Rest as told by your health care provider.  Do not have sex it if hurts.  Keep a journal of your pelvic pain. Write down: ? When the pain started. ? Where the pain is located. ? What seems to make the pain better or worse, such as food or your menstrual cycle. ? Any symptoms you have along with the pain.  Keep all follow-up visits as told by your health care provider. This is important. Contact a health care provider if:  Medicine does not help your pain.  Your pain comes back.  You have new symptoms.  You have abnormal vaginal discharge or bleeding, including bleeding after menopause.  You have a fever or chills.  You are constipated.  You have blood in your urine or stool.  You have foul-smelling urine.  You feel weak or lightheaded. Get help right away if:  You have  sudden severe pain.  Your pain gets steadily worse.  You have severe pain along with fever, nausea, vomiting, or excessive sweating.  You lose consciousness. This information is not intended to replace advice given to you by your health care provider. Make sure you discuss any questions you have with your health care provider. Document Released: 03/17/2004 Document Revised: 05/15/2015 Document Reviewed: 02/08/2015 Elsevier Interactive Patient Education  2018 [place] is a sexually transmitted disease (STD) that can affect both men and women. If left untreated, this infection can:  Damage the female or female organs.  Cause women and men to be unable to have children (be sterile).  Harm a fetus if an infected woman is pregnant.  It is important to get treatment for gonorrhea as soon as possible. It is also necessary for all of your sexual partners to be tested for the infection. What are the causes? This condition is caused by bacteria called Neisseria gonorrhoeae. The infection is spread from person to person through sexual contact, including oral, anal, and vaginal sex. A newborn can contract the infection from his or her mother during birth. What increases the risk? The following factors may make you more likely  to develop this condition:  Being a woman who is younger than 31 years of age and who is sexually active.  Being a woman 9 years of age or older who has: ? A new sex partner. ? More than one sex partner. ? A sex partner who has an STD.  Being a man who has: ? A new sex partner. ? More than one sex partner. ? A sex partner who has an STD.  Using condoms inconsistently.  Currently having, or having previously had, an STD.  Exchanging sex for money or drugs.  What are the signs or symptoms? Some people do not have any symptoms. If you do have symptoms, they may be different for females and males. For females  Pain in the lower  abdomen.  Abnormal vaginal discharge. The discharge may be cloudy, thick, or yellow-green in color.  Bleeding between periods.  Painful sex.  Burning or itching in and around the vagina.  Pain or burning when urinating.  Irritation, pain, bleeding, or discharge from the rectum. This may occur if the infection was spread by anal sex.  Sore throat or swollen lymph nodes in the neck. This may occur if the infection was spread by oral sex. For males  Abnormal discharge from the penis. This discharge may be cloudy, thick, or yellow-green in color.  Pain or burning during urination.  Pain or swelling in the testicles.  Irritation, pain, bleeding, or discharge from the rectum. This may occur if the infection was spread by anal sex.  Sore throat, fever, or swollen lymph nodes in the neck. This may occur if the infection was spread by oral sex. How is this diagnosed? This condition is diagnosed based on:  A physical exam.  A sample of discharge that is examined under a microscope to look for the bacteria. The discharge may be taken from the urethra, cervix, throat, or rectum.  Urine tests.  Not all of test results will be available during your visit. How is this treated? This condition is treated with antibiotic medicines. It is important for treatment to begin as soon as possible. Early treatment may prevent some problems from developing. Do not have sex during treatment. Avoid all types of sexual activity for 7 days after treatment is complete and until any sex partners have been treated. Follow these instructions at home:  Take over-the-counter and prescription medicines only as told by your health care provider.  Take your antibiotic medicine as told by your health care provider. Do not stop taking the antibiotic even if you start to feel better.  Do not have sex until at least 7 days after you and your partner(s) have finished treatment and your health care provider says it is  okay.  It is your responsibility to get your test results. Ask your health care provider, or the department performing the test, when your results will be ready.  If you test positive for gonorrhea, inform your recent sexual partners. This includes any oral, anal, or vaginal sex partners. They need to be checked for gonorrhea even if they do not have symptoms. They may need treatment, even if they test negative for gonorrhea.  Keep all follow-up visits as told by your health care provider. This is important. How is this prevented?  Use latex condoms correctly every time you have sexual intercourse.  Ask if your sexual partner has been tested for STDs and had negative results.  Avoid having multiple sexual partners. Contact a health care provider if:  You develop a bad reaction to the medicine you were prescribed. This may include: ? A rash. ? Nausea. ? Vomiting. ? Diarrhea.  Your symptoms do not get better after a few days of taking antibiotics.  Your symptoms get worse.  You develop new symptoms.  Your pain gets worse.  You have a fever.  You develop pain, itching, or discharge around the eyes. Get help right away if:  You feel dizzy or faint.  You have trouble breathing or have shortness of breath.  You develop an irregular heartbeat.  You have severe abdominal pain with or without shoulder pain.  You develop any bumps or sores (lesions) on your skin.  You develop warmth, redness, pain, or swelling around your joints, such as the knee. Summary  Gonorrhea is an STDthat can affect both men and women.  This condition is caused by bacteria called Neisseria gonorrhoeae. The infection is spread from person to person, usually through sexual contact, including oral, anal, and vaginal sex.  Symptoms vary between males and females. Generally, they include abnormal discharge and burning during urination. Women may also experience painful sex, itching around the vagina, and  bleeding between menstrual periods. Men may also experience swelling of the testicles.  This condition is treated with antibiotic medicines. Do not have sex until at least 7 days after completing antibiotic treatment.  If left untreated, gonorrhea can have serious side effects and complications. This information is not intended to replace advice given to you by your health care provider. Make sure you discuss any questions you have with your health care provider. Document Released: 04/17/2000 Document Revised: 03/20/2016 Document Reviewed: 03/20/2016 Elsevier Interactive Patient Education  2017 [employer].

## 2016-11-21 LAB — HIV ANTIBODY (ROUTINE TESTING W REFLEX): HIV SCREEN 4TH GENERATION: NONREACTIVE

## 2016-11-21 LAB — RPR: RPR Ser Ql: NONREACTIVE

## 2016-11-22 LAB — URINE CULTURE: CULTURE: NO GROWTH

## 2016-12-08 ENCOUNTER — Ambulatory Visit: Payer: Self-pay | Admitting: Obstetrics & Gynecology

## 2017-06-14 ENCOUNTER — Encounter (HOSPITAL_BASED_OUTPATIENT_CLINIC_OR_DEPARTMENT_OTHER): Payer: Self-pay | Admitting: *Deleted

## 2017-06-14 ENCOUNTER — Emergency Department (HOSPITAL_BASED_OUTPATIENT_CLINIC_OR_DEPARTMENT_OTHER)
Admission: EM | Admit: 2017-06-14 | Discharge: 2017-06-14 | Disposition: A | Discharge status: Home / Self Care | Payer: Self-pay | Attending: Emergency Medicine | Admitting: Emergency Medicine

## 2017-06-14 ENCOUNTER — Other Ambulatory Visit: Payer: Self-pay

## 2017-06-14 DIAGNOSIS — W57XXXA Bitten or stung by nonvenomous insect and other nonvenomous arthropods, initial encounter: Secondary | ICD-10-CM | POA: Insufficient documentation

## 2017-06-14 DIAGNOSIS — S60862A Insect bite (nonvenomous) of left wrist, initial encounter: Secondary | ICD-10-CM | POA: Insufficient documentation

## 2017-06-14 DIAGNOSIS — Z79899 Other long term (current) drug therapy: Secondary | ICD-10-CM | POA: Insufficient documentation

## 2017-06-14 DIAGNOSIS — Z91040 Latex allergy status: Secondary | ICD-10-CM | POA: Insufficient documentation

## 2017-06-14 DIAGNOSIS — Y939 Activity, unspecified: Secondary | ICD-10-CM | POA: Insufficient documentation

## 2017-06-14 DIAGNOSIS — Y929 Unspecified place or not applicable: Secondary | ICD-10-CM | POA: Insufficient documentation

## 2017-06-14 DIAGNOSIS — L03114 Cellulitis of left upper limb: Secondary | ICD-10-CM | POA: Insufficient documentation

## 2017-06-14 DIAGNOSIS — Y998 Other external cause status: Secondary | ICD-10-CM | POA: Insufficient documentation

## 2017-06-14 MED ORDER — DOXYCYCLINE HYCLATE 100 MG PO TABS
100.0000 mg | ORAL_TABLET | Freq: Once | ORAL | Status: AC
  Administered 2017-06-14: 100 mg via ORAL
  Filled 2017-06-14: qty 1

## 2017-06-14 MED ORDER — NAPROXEN 500 MG PO TABS: 500.0000 mg | ORAL_TABLET | Freq: Two times a day (BID) | ORAL | 0 refills | Status: DC

## 2017-06-14 MED ORDER — DOXYCYCLINE HYCLATE 100 MG PO CAPS: 100.0000 mg | ORAL_CAPSULE | Freq: Two times a day (BID) | ORAL | 0 refills | Status: DC

## 2017-06-14 MED ORDER — NAPROXEN 250 MG PO TABS
500.0000 mg | ORAL_TABLET | Freq: Once | ORAL | Status: AC
  Administered 2017-06-14: 500 mg via ORAL
  Filled 2017-06-14: qty 2

## 2017-06-14 NOTE — ED Provider Notes (Signed)
[facility] EMERGENCY DEPARTMENT Provider Note   CSN: [id number] Arrival date & time: 06/14/17  0620     History   Chief Complaint Chief Complaint  Patient presents with  . left hand swelling    HPI Margaret Roach is a 32 y.o. female.  HPI  This is a 32 year old female who presents with swelling of the left hand.  Patient reports that she noted left hand swelling on Sunday.  She noted what appeared to be an insect bite on the medial aspect of her wrist.  She states that she covered it with a Band-Aid and noted some drainage on the Band-Aid when she took it off.  She describes as "pus."  She states that she has not noted any significant redness.  No pain with range of motion of the wrist.  Denies fevers.  Currently rates her pain at 5 out of 10.  She has taken ibuprofen with some relief.  Patient also states that it is itchy.  She is mostly worried about the swelling given her job.  Past Medical History:  Diagnosis Date  . Anemia   . Endometriosis   . Ovarian cyst     There are no active problems to display for this patient.   Past Surgical History:  Procedure Laterality Date  . LAPAROSCOPIC OVARIAN CYSTECTOMY      OB History    Gravida Para Term Preterm AB Living   1 1 1     1    SAB TAB Ectopic Multiple Live Births           1       Home Medications    Prior to Admission medications   Medication Sig Start Date End Date Taking? Authorizing Provider  doxycycline (VIBRAMYCIN) 100 MG capsule Take 1 capsule (100 mg total) by mouth 2 (two) times daily. Patient not taking: Reported on 2/97/9892 05/23/39   Delora Fuel, MD  doxycycline (VIBRAMYCIN) 100 MG capsule Take 1 capsule (100 mg total) by mouth 2 (two) times daily. 06/14/17   , Barbette Hair, MD  metroNIDAZOLE (FLAGYL) 500 MG tablet Take 1 tablet (500 mg total) by mouth 2 (two) times daily. Patient not taking: Reported on 7/40/8144 12/20/54   Delora Fuel, MD  naproxen (NAPROSYN) 500 MG tablet Take  1 tablet (500 mg total) by mouth 2 (two) times daily. 06/14/17   , Barbette Hair, MD  Omega-3 Fatty Acids (FISH OIL) 1000 MG CAPS Take 1 capsule by mouth daily.    [provider]  phentermine 37.5 MG capsule Take 37.5 mg by mouth every morning.    [provider]    Family History Family History  Adopted: Yes  Problem Relation Age of Onset  . Cancer Maternal Grandmother     Social History Social History   Tobacco Use  . Smoking status: Never Smoker  . Smokeless tobacco: Never Used  Substance Use Topics  . Alcohol use: Yes    Comment: occ  . Drug use: No     Allergies   Adhesive [tape] and Latex   Review of Systems Review of Systems  Constitutional: Negative for fever.  Musculoskeletal:       Left arm swelling  Skin: Positive for color change and rash.  All other systems reviewed and are negative.    Physical Exam Updated Vital Signs Ht 5\' 3"  (1.6 m)   Wt 71.7 kg (158 lb)   LMP 05/14/2017 (Approximate)   BMI 27.99 kg/m   Physical Exam  Constitutional: She is oriented to person, place, and time. She appears well-developed and well-nourished. No distress.  Overweight  HENT:  Head: Normocephalic and atraumatic.  Cardiovascular: Normal rate and regular rhythm.  Pulmonary/Chest: Effort normal. No respiratory distress.  Musculoskeletal:  Focused examination of the left upper extremity reveals a punctate insect bite over the medial aspect of the left wrist, no active wheezing, mild crusting noted, mild local erythema and swelling, no streaking erythema, normal range of motion of the wrist, 2+ radial pulse  Neurological: She is alert and oriented to person, place, and time.  Skin: Skin is warm and dry.  Psychiatric: She has a normal mood and affect.  Nursing note and vitals reviewed.    ED Treatments / Results  Labs (all labs ordered are listed, but only abnormal results are displayed) Labs Reviewed - No data to display  EKG  EKG  Interpretation None       Radiology No results found.  Procedures Procedures (including critical care time)  Medications Ordered in ED Medications  naproxen (NAPROSYN) tablet 500 mg (not administered)  doxycycline (VIBRA-TABS) tablet 100 mg (not administered)     Initial Impression / Assessment and Plan / ED Course  I have reviewed the triage vital signs and the nursing notes.  Pertinent labs & imaging results that were available during my care of the patient were reviewed by me and considered in my medical decision making (see chart for details).     Patient presents with left hand and wrist swelling.  It appears that she has a small insect bite on the medial aspect of her wrist.  It appears to be localized to the skin.  She may have some early mild cellulitis.  Hand is mildly swollen.  Given itchy and painful nature, suspect some local reaction as well.  Have instructed patient to keep the hand elevated.  Will start on doxycycline to cover for cellulitis.  Otherwise keep covered and clean.  Does not appear to involve the joint and have low suspicion at this time for septic arthritis.  She is otherwise well-appearing.  Patient was given strict return precautions.  After history, exam, and medical workup I feel the patient has been appropriately medically screened and is safe for discharge home. Pertinent diagnoses were discussed with the patient. Patient was given return precautions.\   Final Clinical Impressions(s) / ED Diagnoses   Final diagnoses:  Cellulitis of left upper extremity  Insect bite of left wrist with local reaction, initial encounter    ED Discharge Orders        Ordered    doxycycline (VIBRAMYCIN) 100 MG capsule  2 times daily     06/14/17 0644    naproxen (NAPROSYN) 500 MG tablet  2 times daily     06/14/17 0644       , Barbette Hair, MD 06/14/17 [phone]

## 2017-06-14 NOTE — ED Notes (Signed)
Bacitracin  ointment and a Tegaderm placed to cover wound so pt is able to return to work.

## 2017-06-14 NOTE — ED Triage Notes (Signed)
C/o swelling to left hand that started on Sunday. Pt states she did notice a red, hard area on the outer left part of her wrist that started on Saturday. States area did have  purulent drainage. States low grade fever on Sunday. On assessment pts left hand is swollen and as pea size red area noted to the outer part of wrist area. Pt has one other red area to the upper right part of her back. States both areas itch. Denies any injury. States she has seen a lot of spiders in her home.

## 2017-06-14 NOTE — Discharge Instructions (Signed)
You were seen today with concerning for swelling of the left hand.  This is likely a local reaction to an insect bite.  Given the drainage and redness, you will be started on antibiotics for possible early cellulitis which is an infection of the skin.  If you develop worsening redness or fevers, pain with range of motion of the wrist or any new or worsening symptoms you should be reevaluated.

## 2017-11-26 ENCOUNTER — Ambulatory Visit
Admission: RE | Admit: 2017-11-26 | Discharge: 2017-11-26 | Disposition: A | Discharge status: Home / Self Care | Payer: Managed Care, Other (non HMO) | Source: Ambulatory Visit | Attending: Obstetrics & Gynecology | Admitting: Obstetrics & Gynecology

## 2017-11-26 ENCOUNTER — Other Ambulatory Visit: Payer: Self-pay | Admitting: Obstetrics & Gynecology

## 2017-11-26 DIAGNOSIS — Z30431 Encounter for routine checking of intrauterine contraceptive device: Secondary | ICD-10-CM

## 2018-01-16 ENCOUNTER — Emergency Department (HOSPITAL_BASED_OUTPATIENT_CLINIC_OR_DEPARTMENT_OTHER)
Admission: EM | Admit: 2018-01-16 | Discharge: 2018-01-16 | Disposition: A | Discharge status: Home / Self Care | Payer: Managed Care, Other (non HMO) | Attending: Emergency Medicine | Admitting: Emergency Medicine

## 2018-01-16 ENCOUNTER — Encounter (HOSPITAL_BASED_OUTPATIENT_CLINIC_OR_DEPARTMENT_OTHER): Payer: Self-pay | Admitting: *Deleted

## 2018-01-16 ENCOUNTER — Other Ambulatory Visit: Payer: Self-pay

## 2018-01-16 DIAGNOSIS — M940 Chondrocostal junction syndrome [Tietze]: Secondary | ICD-10-CM | POA: Diagnosis not present

## 2018-01-16 DIAGNOSIS — Z79899 Other long term (current) drug therapy: Secondary | ICD-10-CM | POA: Insufficient documentation

## 2018-01-16 DIAGNOSIS — Z91040 Latex allergy status: Secondary | ICD-10-CM | POA: Insufficient documentation

## 2018-01-16 DIAGNOSIS — R0789 Other chest pain: Secondary | ICD-10-CM | POA: Diagnosis present

## 2018-01-16 LAB — CBC WITH DIFFERENTIAL/PLATELET
BASOS PCT: 1 %
Basophils Absolute: 0 10*3/uL (ref 0.0–0.1)
EOS PCT: 2 %
Eosinophils Absolute: 0.1 10*3/uL (ref 0.0–0.7)
HEMATOCRIT: 31.9 % — AB (ref 36.0–46.0)
Hemoglobin: 10.3 g/dL — ABNORMAL LOW (ref 12.0–15.0)
LYMPHS ABS: 2 10*3/uL (ref 0.7–4.0)
Lymphocytes Relative: 43 %
MCH: 24.3 pg — ABNORMAL LOW (ref 26.0–34.0)
MCHC: 32.3 g/dL (ref 30.0–36.0)
MCV: 75.4 fL — AB (ref 78.0–100.0)
MONO ABS: 0.5 10*3/uL (ref 0.1–1.0)
MONOS PCT: 12 %
Neutro Abs: 1.9 10*3/uL (ref 1.7–7.7)
Neutrophils Relative %: 42 %
Platelets: 302 10*3/uL (ref 150–400)
RBC: 4.23 MIL/uL (ref 3.87–5.11)
RDW: 15.9 % — AB (ref 11.5–15.5)
WBC: 4.5 10*3/uL (ref 4.0–10.5)

## 2018-01-16 LAB — BASIC METABOLIC PANEL
Anion gap: 9 (ref 5–15)
BUN: 13 mg/dL (ref 6–20)
CO2: 27 mmol/L (ref 22–32)
CREATININE: 0.82 mg/dL (ref 0.44–1.00)
Calcium: 8.9 mg/dL (ref 8.9–10.3)
Chloride: 104 mmol/L (ref 98–111)
GFR calc Af Amer: >60 mL/min (ref >60)
GFR calc non Af Amer: >60 mL/min (ref >60)
GLUCOSE: 96 mg/dL (ref 70–99)
Potassium: 4.1 mmol/L (ref 3.5–5.1)
Sodium: 140 mmol/L (ref 135–145)

## 2018-01-16 LAB — TROPONIN I: Troponin I: <0.03 ng/mL (ref <0.03)

## 2018-01-16 MED ORDER — KETOROLAC TROMETHAMINE 15 MG/ML IJ SOLN
15.0000 mg | Freq: Once | INTRAMUSCULAR | Status: AC
  Administered 2018-01-16: 15 mg via INTRAVENOUS
  Filled 2018-01-16: qty 1

## 2018-01-16 NOTE — ED Notes (Signed)
Pt states she took 1st dose of phentermine today. Has taking it  before w/o problems

## 2018-01-16 NOTE — ED Triage Notes (Signed)
Pt reports 3 hours of sharp chest pain, worse with lifting and pulling

## 2018-01-16 NOTE — ED Provider Notes (Signed)
[facility] Provider Note: Georgena Spurling, MD, FACEP  CSN: [id number] MRN: [id number] ARRIVAL: 01/16/18 at [place]: [place]  Chest Pain   HISTORY OF PRESENT ILLNESS  01/16/18 12:50 AM Margaret Roach is a 32 y.o. female who complains of anterior chest pain that began yesterday afternoon about 3 PM after taking phentermine.  She has taken phentermine before without difficulty.  The pain is located in the parasternal regions bilaterally and she rates it as a 7 out of 10.  She describes the pain as a pulling sensation.  It is worse with movement or palpation.  She is not currently short of breath but it does hurt to take a deep breath.  She did have some palpitations earlier which is common when taking phentermine.   Past Medical History:  Diagnosis Date  . Anemia   . Endometriosis   . Ovarian cyst     Past Surgical History:  Procedure Laterality Date  . LAPAROSCOPIC OVARIAN CYSTECTOMY      Family History  Adopted: Yes  Problem Relation Age of Onset  . Cancer Maternal Grandmother     Social History   Tobacco Use  . Smoking status: Never Smoker  . Smokeless tobacco: Never Used  Substance Use Topics  . Alcohol use: Yes    Comment: occ  . Drug use: No    Prior to Admission medications   Medication Sig Start Date End Date Taking? Authorizing Provider  phentermine 37.5 MG capsule Take 37.5 mg by mouth every morning.   Yes [provider]  Omega-3 Fatty Acids (FISH OIL) 1000 MG CAPS Take 1 capsule by mouth daily.    [provider]    Allergies Adhesive [tape] and Latex   REVIEW OF SYSTEMS  Negative except as noted here or in the History of Present Illness.   PHYSICAL EXAMINATION  Initial Vital Signs Blood pressure (!) 134/100, pulse 76, temperature 98.1 F (36.7 C), temperature source Oral, resp. rate 20, height 5\' 3"  (1.6 m), weight 72.1 kg, last menstrual period 01/13/2018, SpO2 100  %.  Examination General: Well-developed, well-nourished female in no acute distress; appearance consistent with age of record HENT: normocephalic; atraumatic Eyes: pupils equal, round and reactive to light; extraocular muscles intact Neck: supple Heart: regular rate and rhythm; no murmur Lungs: clear to auscultation bilaterally Chest: Bilateral parasternal tenderness Abdomen: soft; nondistended; nontender; bowel sounds present Extremities: No deformity; full range of motion; pulses normal Neurologic: Awake, alert and oriented; motor function intact in all extremities and symmetric; no facial droop Skin: Warm and dry Psychiatric: Normal mood and affect   RESULTS  Summary of this visit's results, reviewed by myself:  EKG Interpretation:  Date & Time: 01/16/2018 12:45 AM  Rate: 69  Rhythm: normal sinus rhythm  QRS Axis: left  Intervals: normal  ST/T Wave abnormalities: normal  Conduction Disutrbances:none  Narrative Interpretation:   Old EKG Reviewed: none available  Laboratory Studies: Results for orders placed or performed during the hospital encounter of 01/16/18 (from the past 24 hour(s))  CBC with Differential/Platelet     Status: Abnormal   Collection Time: 01/16/18  1:03 AM  Result Value Ref Range   WBC 4.5 4.0 - 10.5 K/uL   RBC 4.23 3.87 - 5.11 MIL/uL   Hemoglobin 10.3 (L) 12.0 - 15.0 g/dL   HCT 31.9 (L) 36.0 - 46.0 %   MCV 75.4 (L) 78.0 - 100.0 fL   MCH 24.3 (L) 26.0 - 34.0 pg  MCHC 32.3 30.0 - 36.0 g/dL   RDW 15.9 (H) 11.5 - 15.5 %   Platelets 302 150 - 400 K/uL   Neutrophils Relative % 42 %   Neutro Abs 1.9 1.7 - 7.7 K/uL   Lymphocytes Relative 43 %   Lymphs Abs 2.0 0.7 - 4.0 K/uL   Monocytes Relative 12 %   Monocytes Absolute 0.5 0.1 - 1.0 K/uL   Eosinophils Relative 2 %   Eosinophils Absolute 0.1 0.0 - 0.7 K/uL   Basophils Relative 1 %   Basophils Absolute 0.0 0.0 - 0.1 K/uL  Basic metabolic panel     Status: None   Collection Time: 01/16/18  1:03 AM   Result Value Ref Range   Sodium 140 135 - 145 mmol/L   Potassium 4.1 3.5 - 5.1 mmol/L   Chloride 104 98 - 111 mmol/L   CO2 27 22 - 32 mmol/L   Glucose, Bld 96 70 - 99 mg/dL   BUN 13 6 - 20 mg/dL   Creatinine, Ser 0.82 0.44 - 1.00 mg/dL   Calcium 8.9 8.9 - 10.3 mg/dL   GFR calc non Af Amer >60 >60 mL/min   GFR calc Af Amer >60 >60 mL/min   Anion gap 9 5 - 15  Troponin I     Status: None   Collection Time: 01/16/18  1:03 AM  Result Value Ref Range   Troponin I <0.03 <0.03 ng/mL   Imaging Studies: No results found.  ED COURSE and MDM  Nursing notes and initial vitals signs, including pulse oximetry, reviewed.  Vitals:   01/16/18 0040 01/16/18 0103  BP: (!) 134/100 (!) 145/98  Pulse: 76 70  Resp: 20 18  Temp: 98.1 F (36.7 C)   TempSrc: Oral   SpO2: 100% 100%  Weight: 72.1 kg   Height: 5\' 3"  (1.6 m)    1:42 AM Significant relief with IV Toradol.  Exam consistent with costochondritis.  Patient states she has naproxen at home which she can take should symptoms persist.  PROCEDURES    ED DIAGNOSES     ICD-10-CM   1. Costochondritis M94.0        Jinger Middlesworth, Jenny Reichmann, MD 01/16/18 [phone]

## 2018-02-15 ENCOUNTER — Other Ambulatory Visit: Payer: Self-pay | Admitting: Obstetrics & Gynecology

## 2018-02-21 ENCOUNTER — Encounter (HOSPITAL_BASED_OUTPATIENT_CLINIC_OR_DEPARTMENT_OTHER): Payer: Self-pay | Admitting: *Deleted

## 2018-02-21 ENCOUNTER — Other Ambulatory Visit: Payer: Self-pay

## 2018-02-21 NOTE — Progress Notes (Addendum)
Spoke with patient via telephone for pre op interview. No medications AM of surgery. May have clear liquids until 7AM. Arrival time 1100. NPO after MN. Patient to have CBC drawn at pre op MD appointment.EKG on chart and in Epic.

## 2018-03-01 ENCOUNTER — Encounter (HOSPITAL_BASED_OUTPATIENT_CLINIC_OR_DEPARTMENT_OTHER): Payer: Self-pay | Admitting: *Deleted

## 2018-03-01 ENCOUNTER — Ambulatory Visit (HOSPITAL_BASED_OUTPATIENT_CLINIC_OR_DEPARTMENT_OTHER): Payer: Managed Care, Other (non HMO) | Admitting: Anesthesiology

## 2018-03-01 ENCOUNTER — Ambulatory Visit (HOSPITAL_BASED_OUTPATIENT_CLINIC_OR_DEPARTMENT_OTHER)
Admission: RE | Admit: 2018-03-01 | Discharge: 2018-03-01 | Disposition: A | Discharge status: Home / Self Care | Payer: Managed Care, Other (non HMO) | Source: Ambulatory Visit | Attending: Obstetrics & Gynecology | Admitting: Obstetrics & Gynecology

## 2018-03-01 ENCOUNTER — Encounter (HOSPITAL_BASED_OUTPATIENT_CLINIC_OR_DEPARTMENT_OTHER)
Admission: RE | Disposition: A | Discharge status: Home / Self Care | Payer: Self-pay | Source: Ambulatory Visit | Attending: Obstetrics & Gynecology

## 2018-03-01 DIAGNOSIS — N736 Female pelvic peritoneal adhesions (postinfective): Secondary | ICD-10-CM | POA: Insufficient documentation

## 2018-03-01 DIAGNOSIS — D649 Anemia, unspecified: Secondary | ICD-10-CM | POA: Diagnosis not present

## 2018-03-01 DIAGNOSIS — N83201 Unspecified ovarian cyst, right side: Secondary | ICD-10-CM | POA: Diagnosis not present

## 2018-03-01 DIAGNOSIS — N946 Dysmenorrhea, unspecified: Secondary | ICD-10-CM

## 2018-03-01 DIAGNOSIS — N803 Endometriosis of pelvic peritoneum: Secondary | ICD-10-CM | POA: Insufficient documentation

## 2018-03-01 DIAGNOSIS — N971 Female infertility of tubal origin: Secondary | ICD-10-CM

## 2018-03-01 DIAGNOSIS — N80129 Deep endometriosis of ovary, unspecified ovary: Secondary | ICD-10-CM

## 2018-03-01 DIAGNOSIS — D271 Benign neoplasm of left ovary: Secondary | ICD-10-CM | POA: Insufficient documentation

## 2018-03-01 DIAGNOSIS — N979 Female infertility, unspecified: Secondary | ICD-10-CM | POA: Insufficient documentation

## 2018-03-01 DIAGNOSIS — N8302 Follicular cyst of left ovary: Secondary | ICD-10-CM | POA: Insufficient documentation

## 2018-03-01 DIAGNOSIS — N83202 Unspecified ovarian cyst, left side: Secondary | ICD-10-CM | POA: Diagnosis present

## 2018-03-01 DIAGNOSIS — N801 Endometriosis of ovary: Secondary | ICD-10-CM

## 2018-03-01 DIAGNOSIS — N941 Unspecified dyspareunia: Secondary | ICD-10-CM

## 2018-03-01 HISTORY — PX: CHROMOPERTUBATION: SHX6288

## 2018-03-01 HISTORY — PX: LAPAROSCOPIC OVARIAN CYSTECTOMY: SHX6248

## 2018-03-01 LAB — CBC
HCT: 38.7 % (ref 36.0–46.0)
HEMOGLOBIN: 11.6 g/dL — AB (ref 12.0–15.0)
MCH: 23.8 pg — ABNORMAL LOW (ref 26.0–34.0)
MCHC: 30 g/dL (ref 30.0–36.0)
MCV: 79.3 fL — ABNORMAL LOW (ref 80.0–100.0)
Platelets: 268 10*3/uL (ref 150–400)
RBC: 4.88 MIL/uL (ref 3.87–5.11)
RDW: 16.7 % — AB (ref 11.5–15.5)
WBC: 4.2 10*3/uL (ref 4.0–10.5)
nRBC: 0 % (ref 0.0–0.2)

## 2018-03-01 LAB — POCT PREGNANCY, URINE: Preg Test, Ur: NEGATIVE

## 2018-03-01 SURGERY — EXCISION, CYST, OVARY, LAPAROSCOPIC
Anesthesia: General | Site: Abdomen | Laterality: Left

## 2018-03-01 MED ORDER — ROCURONIUM BROMIDE 10 MG/ML (PF) SYRINGE
PREFILLED_SYRINGE | INTRAVENOUS | Status: AC
  Filled 2018-03-01: qty 10

## 2018-03-01 MED ORDER — MIDAZOLAM HCL 2 MG/2ML IJ SOLN
INTRAMUSCULAR | Status: DC | PRN
  Administered 2018-03-01: 2 mg via INTRAVENOUS

## 2018-03-01 MED ORDER — IBUPROFEN 600 MG PO TABS: ORAL_TABLET | ORAL | 0 refills | Status: AC

## 2018-03-01 MED ORDER — METHYLENE BLUE 0.5 % INJ SOLN
INTRAVENOUS | Status: DC | PRN
  Administered 2018-03-01: 3 mL

## 2018-03-01 MED ORDER — METOCLOPRAMIDE HCL 5 MG/ML IJ SOLN
INTRAMUSCULAR | Status: DC | PRN
  Administered 2018-03-01: 5 mg via INTRAVENOUS

## 2018-03-01 MED ORDER — MIDAZOLAM HCL 2 MG/2ML IJ SOLN: INTRAMUSCULAR | Status: DC | PRN

## 2018-03-01 MED ORDER — OXYCODONE HCL 5 MG PO TABS
ORAL_TABLET | ORAL | Status: AC
  Filled 2018-03-01: qty 1

## 2018-03-01 MED ORDER — DEXAMETHASONE SODIUM PHOSPHATE 10 MG/ML IJ SOLN
INTRAMUSCULAR | Status: AC
  Filled 2018-03-01: qty 1

## 2018-03-01 MED ORDER — ACETAMINOPHEN 10 MG/ML IV SOLN
INTRAVENOUS | Status: AC
  Filled 2018-03-01: qty 100

## 2018-03-01 MED ORDER — ONDANSETRON HCL 4 MG/2ML IJ SOLN
INTRAMUSCULAR | Status: AC
  Filled 2018-03-01: qty 2

## 2018-03-01 MED ORDER — SUGAMMADEX SODIUM 200 MG/2ML IV SOLN
INTRAVENOUS | Status: AC
  Filled 2018-03-01: qty 2

## 2018-03-01 MED ORDER — LIDOCAINE 2% (20 MG/ML) 5 ML SYRINGE
INTRAMUSCULAR | Status: AC
  Filled 2018-03-01: qty 5

## 2018-03-01 MED ORDER — OXYCODONE HCL 5 MG PO TABS
5.0000 mg | ORAL_TABLET | Freq: Once | ORAL | Status: AC | PRN
  Administered 2018-03-01: 5 mg via ORAL
  Filled 2018-03-01: qty 1

## 2018-03-01 MED ORDER — LIDOCAINE 2% (20 MG/ML) 5 ML SYRINGE
INTRAMUSCULAR | Status: DC | PRN
  Administered 2018-03-01: 100 mg via INTRAVENOUS

## 2018-03-01 MED ORDER — FENTANYL CITRATE (PF) 100 MCG/2ML IJ SOLN
INTRAMUSCULAR | Status: AC
  Filled 2018-03-01: qty 2

## 2018-03-01 MED ORDER — OXYCODONE HCL 5 MG/5ML PO SOLN
5.0000 mg | Freq: Once | ORAL | Status: AC | PRN
  Filled 2018-03-01: qty 5

## 2018-03-01 MED ORDER — FENTANYL CITRATE (PF) 100 MCG/2ML IJ SOLN
INTRAMUSCULAR | Status: DC | PRN
  Administered 2018-03-01 (×3): 50 ug via INTRAVENOUS
  Administered 2018-03-01: 25 ug via INTRAVENOUS
  Administered 2018-03-01: 50 ug via INTRAVENOUS
  Administered 2018-03-01: 25 ug via INTRAVENOUS

## 2018-03-01 MED ORDER — DEXAMETHASONE SODIUM PHOSPHATE 10 MG/ML IJ SOLN
INTRAMUSCULAR | Status: DC | PRN
  Administered 2018-03-01: 10 mg via INTRAVENOUS

## 2018-03-01 MED ORDER — FENTANYL CITRATE (PF) 250 MCG/5ML IJ SOLN
INTRAMUSCULAR | Status: AC
  Filled 2018-03-01: qty 5

## 2018-03-01 MED ORDER — MIDAZOLAM HCL 2 MG/2ML IJ SOLN
INTRAMUSCULAR | Status: AC
  Filled 2018-03-01: qty 2

## 2018-03-01 MED ORDER — PROPOFOL 10 MG/ML IV BOLUS
INTRAVENOUS | Status: AC
  Filled 2018-03-01: qty 20

## 2018-03-01 MED ORDER — ROCURONIUM BROMIDE 10 MG/ML (PF) SYRINGE
PREFILLED_SYRINGE | INTRAVENOUS | Status: DC | PRN
  Administered 2018-03-01: 60 mg via INTRAVENOUS

## 2018-03-01 MED ORDER — ONDANSETRON HCL 4 MG/2ML IJ SOLN
INTRAMUSCULAR | Status: DC | PRN
  Administered 2018-03-01: 4 mg via INTRAVENOUS

## 2018-03-01 MED ORDER — ONDANSETRON HCL 4 MG/2ML IJ SOLN
4.0000 mg | Freq: Once | INTRAMUSCULAR | Status: DC | PRN
  Filled 2018-03-01: qty 2

## 2018-03-01 MED ORDER — FENTANYL CITRATE (PF) 100 MCG/2ML IJ SOLN
25.0000 ug | INTRAMUSCULAR | Status: DC | PRN
  Administered 2018-03-01 (×3): 25 ug via INTRAVENOUS
  Filled 2018-03-01: qty 1

## 2018-03-01 MED ORDER — KETOROLAC TROMETHAMINE 30 MG/ML IJ SOLN
INTRAMUSCULAR | Status: DC | PRN
  Administered 2018-03-01: 30 mg via INTRAVENOUS

## 2018-03-01 MED ORDER — BUPIVACAINE HCL 0.25 % IJ SOLN
INTRAMUSCULAR | Status: DC | PRN
  Administered 2018-03-01: 5 mL

## 2018-03-01 MED ORDER — METOCLOPRAMIDE HCL 5 MG/ML IJ SOLN
INTRAMUSCULAR | Status: AC
  Filled 2018-03-01: qty 2

## 2018-03-01 MED ORDER — DOXYCYCLINE HYCLATE 100 MG PO TABS: 100.0000 mg | ORAL_TABLET | Freq: Two times a day (BID) | ORAL | 0 refills | Status: AC

## 2018-03-01 MED ORDER — HEMOSTATIC AGENTS (NO CHARGE) OPTIME
TOPICAL | Status: DC | PRN
  Administered 2018-03-01: 1 via TOPICAL

## 2018-03-01 MED ORDER — KETOROLAC TROMETHAMINE 30 MG/ML IJ SOLN
INTRAMUSCULAR | Status: AC
  Filled 2018-03-01: qty 1

## 2018-03-01 MED ORDER — OXYCODONE-ACETAMINOPHEN 5-325 MG PO TABS: 1.0000 | ORAL_TABLET | Freq: Four times a day (QID) | ORAL | 0 refills | Status: DC | PRN

## 2018-03-01 MED ORDER — SUGAMMADEX SODIUM 200 MG/2ML IV SOLN
INTRAVENOUS | Status: DC | PRN
  Administered 2018-03-01: 150 mg via INTRAVENOUS

## 2018-03-01 MED ORDER — ACETAMINOPHEN 10 MG/ML IV SOLN
INTRAVENOUS | Status: DC | PRN
  Administered 2018-03-01: 1000 mg via INTRAVENOUS

## 2018-03-01 MED ORDER — MENTHOL 3 MG MT LOZG
LOZENGE | OROMUCOSAL | Status: AC
  Filled 2018-03-01: qty 9

## 2018-03-01 MED ORDER — PROPOFOL 10 MG/ML IV BOLUS
INTRAVENOUS | Status: DC | PRN
  Administered 2018-03-01: 150 mg via INTRAVENOUS

## 2018-03-01 MED ORDER — SODIUM CHLORIDE 0.9 % IR SOLN
Status: DC | PRN
  Administered 2018-03-01: 3000 mL

## 2018-03-01 MED ORDER — LACTATED RINGERS IV SOLN
INTRAVENOUS | Status: DC
  Administered 2018-03-01 (×2): via INTRAVENOUS
  Filled 2018-03-01: qty 1000

## 2018-03-01 SURGICAL SUPPLY — 53 items
BENZOIN TINCTURE PRP APPL 2/3 (GAUZE/BANDAGES/DRESSINGS) IMPLANT
CABLE HIGH FREQUENCY MONO STRZ (ELECTRODE) ×3 IMPLANT
CATH ROBINSON RED A/P 16FR (CATHETERS) IMPLANT
CONT SPEC 4OZ CLIKSEAL STRL BL (MISCELLANEOUS) ×3 IMPLANT
COVER MAYO STAND STRL (DRAPES) ×3 IMPLANT
DERMABOND ADVANCED (GAUZE/BANDAGES/DRESSINGS) ×1
DERMABOND ADVANCED .7 DNX12 (GAUZE/BANDAGES/DRESSINGS) ×2 IMPLANT
DRSG COVADERM PLUS 2X2 (GAUZE/BANDAGES/DRESSINGS) IMPLANT
DRSG OPSITE POSTOP 3X4 (GAUZE/BANDAGES/DRESSINGS) IMPLANT
DURAPREP 26ML APPLICATOR (WOUND CARE) ×3 IMPLANT
ELECT REM PT RETURN 9FT ADLT (ELECTROSURGICAL) ×3
ELECTRODE REM PT RTRN 9FT ADLT (ELECTROSURGICAL) ×2 IMPLANT
GAUZE 4X4 16PLY RFD (DISPOSABLE) ×3 IMPLANT
GLOVE BIO SURGEON STRL SZ 6.5 (GLOVE) ×3 IMPLANT
GLOVE BIOGEL PI IND STRL 7.0 (GLOVE) ×4 IMPLANT
GLOVE BIOGEL PI IND STRL 7.5 (GLOVE) ×2 IMPLANT
GLOVE BIOGEL PI IND STRL 8.5 (GLOVE) ×2 IMPLANT
GLOVE BIOGEL PI INDICATOR 7.0 (GLOVE) ×2
GLOVE BIOGEL PI INDICATOR 7.5 (GLOVE) ×1
GLOVE BIOGEL PI INDICATOR 8.5 (GLOVE) ×1
GLOVE INDICATOR 8.5 STRL (GLOVE) ×3 IMPLANT
GLOVE SURG SS PI 7.0 STRL IVOR (GLOVE) ×3 IMPLANT
GOWN STRL REUS W/TWL LRG LVL3 (GOWN DISPOSABLE) ×12 IMPLANT
HOLDER FOLEY CATH W/STRAP (MISCELLANEOUS) IMPLANT
IV STOPCOCK 4 WAY 40  W/Y SET (IV SOLUTION)
IV STOPCOCK 4 WAY 40 W/Y SET (IV SOLUTION) IMPLANT
KIT TURNOVER CYSTO (KITS) ×3 IMPLANT
LIGASURE 5MM LAPAROSCOPIC (INSTRUMENTS) IMPLANT
MANIPULATOR UTERINE 4.5 ZUMI (MISCELLANEOUS) IMPLANT
NS IRRIG 500ML POUR BTL (IV SOLUTION) ×3 IMPLANT
PACK LAPAROSCOPY BASIN (CUSTOM PROCEDURE TRAY) ×3 IMPLANT
PAD OB MATERNITY 4.3X12.25 (PERSONAL CARE ITEMS) ×3 IMPLANT
PAD PREP 24X48 CUFFED NSTRL (MISCELLANEOUS) ×3 IMPLANT
POUCH LAPAROSCOPIC INSTRUMENT (MISCELLANEOUS) ×3 IMPLANT
SEPRAFILM MEMBRANE 5X6 (MISCELLANEOUS) ×6 IMPLANT
SET IRRIG TUBING LAPAROSCOPIC (IRRIGATION / IRRIGATOR) IMPLANT
SOLUTION ELECTROLUBE (MISCELLANEOUS) IMPLANT
STRIP CLOSURE SKIN 1/4X4 (GAUZE/BANDAGES/DRESSINGS) IMPLANT
SUT MNCRL AB 4-0 PS2 18 (SUTURE) ×3 IMPLANT
SUT VICRYL 0 UR6 27IN ABS (SUTURE) ×3 IMPLANT
SYR 50ML LL SCALE MARK (SYRINGE) ×3 IMPLANT
SYR CONTROL 10ML LL (SYRINGE) ×6 IMPLANT
SYRINGE 60CC LL (MISCELLANEOUS) ×3 IMPLANT
SYS BAG RETRIEVAL 10MM (BASKET)
SYSTEM BAG RETRIEVAL 10MM (BASKET) IMPLANT
TOWEL OR 17X24 6PK STRL BLUE (TOWEL DISPOSABLE) ×6 IMPLANT
TRAY FOLEY W/BAG SLVR 14FR (SET/KITS/TRAYS/PACK) ×3 IMPLANT
TROCAR BLADELESS OPT 5 100 (ENDOMECHANICALS) ×9 IMPLANT
TROCAR XCEL NON-BLD 11X100MML (ENDOMECHANICALS) ×3 IMPLANT
TUBE CONNECTING 12X1/4 (SUCTIONS) IMPLANT
TUBING INSUF HEATED (TUBING) ×3 IMPLANT
WARMER LAPAROSCOPE (MISCELLANEOUS) ×3 IMPLANT
WATER STERILE IRR 500ML POUR (IV SOLUTION) ×3 IMPLANT

## 2018-03-01 NOTE — Anesthesia Preprocedure Evaluation (Addendum)
Anesthesia Evaluation  Patient identified by MRN, date of birth, ID band Patient awake    Reviewed: Allergy & Precautions, NPO status , Patient's Chart, lab work & pertinent test results  History of Anesthesia Complications Negative for: history of anesthetic complications  Airway Mallampati: II  TM Distance: >3 FB Neck ROM: Full    Dental no notable dental hx.    Pulmonary neg pulmonary ROS,    Pulmonary exam normal        Cardiovascular negative cardio ROS Normal cardiovascular exam     Neuro/Psych negative neurological ROS  negative psych ROS   GI/Hepatic negative GI ROS, Neg liver ROS,   Endo/Other  negative endocrine ROS  Renal/GU negative Renal ROS  negative genitourinary   Musculoskeletal negative musculoskeletal ROS (+)   Abdominal   Peds  Hematology negative hematology ROS (+)   Anesthesia Other Findings   Reproductive/Obstetrics negative OB ROS                            Anesthesia Physical Anesthesia Plan  ASA: I  Anesthesia Plan: General   Post-op Pain Management:    Induction: Intravenous  PONV Risk Score and Plan: 4 or greater and Ondansetron, Dexamethasone, Midazolam and Treatment may vary due to age or medical condition  Airway Management Planned: Oral ETT  Additional Equipment: None  Intra-op Plan:   Post-operative Plan: Extubation in OR  Informed Consent: I have reviewed the patients History and Physical, chart, labs and discussed the procedure including the risks, benefits and alternatives for the proposed anesthesia with the patient or authorized representative who has indicated his/her understanding and acceptance.     Plan Discussed with:   Anesthesia Plan Comments:         Anesthesia Quick Evaluation

## 2018-03-01 NOTE — Discharge Instructions (Signed)
Call [facility] @ [phone] if:  You have a temperature greater than or equal to 100.4 degrees Farenheit orally You have pain that is not made better by the pain medication given and taken as directed You have excessive bleeding or problems urinating  Take Colace (Docusate Sodium/Stool Softener) 100 mg 2-3 times daily while taking narcotic pain medicine to avoid constipation or until bowel movements are regular. Take Ibuprofen 600 mg with food every 6 hours for 5 days then as needed for pain  You may drive after 24 hours You may shower tomorrow  You may resume a regular diet      Post Anesthesia Home Care Instructions  Activity: Get plenty of rest for the remainder of the day. A responsible adult should stay with you for 24 hours following the procedure.  For the next 24 hours, DO NOT: -Drive a car -Paediatric nurse -Drink alcoholic beverages -Take any medication unless instructed by your physician -Make any legal decisions or sign important papers.  Meals: Start with liquid foods such as gelatin or soup. Progress to regular foods as tolerated. Avoid greasy, spicy, heavy foods. If nausea and/or vomiting occur, drink only clear liquids until the nausea and/or vomiting subsides. Call your physician if vomiting continues.  Special Instructions/Symptoms: Your throat may feel dry or sore from the anesthesia or the breathing tube placed in your throat during surgery. If this causes discomfort, gargle with warm salt water. The discomfort should disappear within 24 hours.  If you had a scopolamine patch placed behind your ear for the management of post- operative nausea and/or vomiting:  1. The medication in the patch is effective for 72 hours, after which it should be removed.  Wrap patch in a tissue and discard in the trash. Wash hands thoroughly with soap and water. 2. You may remove the patch earlier than 72 hours if you experience unpleasant side effects which may  include dry mouth, dizziness or visual disturbances. 3. Avoid touching the patch. Wash your hands with soap and water after contact with the patch.    Keep incisions clean and dry  Do not lift over 15 pounds for 6 weeks Avoid anything in vagina  until after your post-operative visit.

## 2018-03-01 NOTE — H&P (Signed)
Margaret Roach is an 32 y.o. female P1 with known history of endometriosis presents for scheduled Laparoscopic cystectomy / removal of bilateral endometriomas.  Patient with dysmenorrhea, dyspareunia and  Secondary infertility.   Pertinent Gynecological History: Menses: flow is excessive with use of 5 pads or tampons on heaviest days Bleeding: dysfunctional uterine bleeding Contraception: none DES exposure: denies Blood transfusions: none Sexually transmitted diseases: no past history Previous GYN Procedures: Laparoscopic ovarian cystectomy  Last mammogram: n/a  Last pap: normal Date: 01/2018 OB History: G1, P1  Menstrual History: Menarche age: 68 Patient's last menstrual period was 02/09/2018 (approximate).    Past Medical History:  Diagnosis Date  . Anemia   . Endometriosis   . Ovarian cyst     Past Surgical History:  Procedure Laterality Date  . LAPAROSCOPIC OVARIAN CYSTECTOMY      Family History  Adopted: Yes  Problem Relation Age of Onset  . Cancer Maternal Grandmother     Social History:  reports that she has never smoked. She has never used smokeless tobacco. She reports that she drinks alcohol. She reports that she does not use drugs.  Allergies:  Allergies  Allergen Reactions  . Adhesive [Tape] Rash  . Latex Hives and Rash    No medications prior to admission.    ROS  As per HPI  Blood pressure 139/85, pulse 86, temperature 98.4 F (36.9 C), temperature source Oral, resp. rate 16, height 5\' 3"  (1.6 m), weight 75 kg, last menstrual period 02/09/2018, SpO2 100 %. Physical Exam Pelvic Exam deferred to OR  Results for orders placed or performed during the hospital encounter of 03/01/18 (from the past 24 hour(s))  Pregnancy, urine POC     Status: None   Collection Time: 03/01/18 11:13 AM  Result Value Ref Range   Preg Test, Ur NEGATIVE NEGATIVE  CBC     Status: Abnormal   Collection Time: 03/01/18 12:05 PM  Result Value Ref Range   WBC 4.2 4.0 -  10.5 K/uL   RBC 4.88 3.87 - 5.11 MIL/uL   Hemoglobin 11.6 (L) 12.0 - 15.0 g/dL   HCT 38.7 36.0 - 46.0 %   MCV 79.3 (L) 80.0 - 100.0 fL   MCH 23.8 (L) 26.0 - 34.0 pg   MCHC 30.0 30.0 - 36.0 g/dL   RDW 16.7 (H) 11.5 - 15.5 %   Platelets 268 150 - 400 K/uL   nRBC 0.0 0.0 - 0.2 %    No results found.  Assessment/Plan: 32 year old with bilateral endometriomas, secondary infertility  On call to OR for laparoscopic bilateral ovarian cystectomies, possible fulguration of endometriosis Chromopertubation to evaluate patency of fallopian tubes.  Discussed Risks, benefits and alternatives with patient and she voiced understanding.  All questions answered.  Consent signed, witnessed and placed into chart.    Tyvon Eggenberger, [place] 03/01/2018, 12:47 PM

## 2018-03-01 NOTE — Transfer of Care (Signed)
  Last Vitals:  Vitals Value Taken Time  BP 137/79 03/01/2018  2:51 PM  Temp 36.3 C 03/01/2018  2:51 PM  Pulse 76 03/01/2018  2:54 PM  Resp 18 03/01/2018  2:54 PM  SpO2 100 % 03/01/2018  2:54 PM  Vitals shown include unvalidated device data.  Last Pain:  Vitals:   03/01/18 1111  TempSrc: Oral         Immediate Anesthesia Transfer of Care Note  Patient: Margaret Roach  Procedure(s) Performed: Procedure(s) (LRB): LAPAROSCOPIC OVARIAN CYSTECTOMY, LYSIS OF ADHESIONS (Left) CHROMOPERTUBATION (Bilateral)  Patient Location: PACU  Anesthesia Type: General  Level of Consciousness: awake, alert  and oriented  Airway & Oxygen Therapy: Patient Spontanous Breathing and Patient connected to nasal cannula oxygen  Post-op Assessment: Report given to PACU RN and Post -op Vital signs reviewed and stable  Post vital signs: Reviewed and stable  Complications: No apparent anesthesia complications

## 2018-03-01 NOTE — Anesthesia Postprocedure Evaluation (Signed)
Anesthesia Post Note  Patient: Margaret Roach  Procedure(s) Performed: LAPAROSCOPIC OVARIAN CYSTECTOMY, LYSIS OF ADHESIONS (Left Abdomen) CHROMOPERTUBATION (Bilateral Abdomen)     Patient location during evaluation: PACU Anesthesia Type: General Level of consciousness: awake and alert Pain management: pain level controlled Vital Signs Assessment: post-procedure vital signs reviewed and stable Respiratory status: spontaneous breathing, nonlabored ventilation and respiratory function stable Cardiovascular status: blood pressure returned to baseline and stable Postop Assessment: no apparent nausea or vomiting Anesthetic complications: no    Last Vitals:  Vitals:   03/01/18 1451 03/01/18 1500  BP: 137/79 134/87  Pulse: 79 71  Resp: 16 15  Temp: (!) 36.3 C   SpO2: 100% 100%    Last Pain:  Vitals:   03/01/18 1500  TempSrc:   PainSc: [place]

## 2018-03-01 NOTE — Anesthesia Procedure Notes (Signed)
Procedure Name: Intubation Date/Time: 03/01/2018 12:59 PM Performed by: Lidia Collum, MD Pre-anesthesia Checklist: Patient identified, Emergency Drugs available, Suction available and Patient being monitored Patient Re-evaluated:Patient Re-evaluated prior to induction Oxygen Delivery Method: Circle system utilized Preoxygenation: Pre-oxygenation with 100% oxygen Induction Type: IV induction Ventilation: Mask ventilation without difficulty Laryngoscope Size: Mac and 3 Grade View: Grade I Tube type: Oral Tube size: 7.0 mm Number of attempts: 1 Airway Equipment and Method: Stylet and Oral airway Placement Confirmation: ETT inserted through vocal cords under direct vision,  positive ETCO2 and breath sounds checked- equal and bilateral Secured at: 21 cm Tube secured with: Tape Dental Injury: Teeth and Oropharynx as per pre-operative assessment

## 2018-03-02 ENCOUNTER — Encounter (HOSPITAL_BASED_OUTPATIENT_CLINIC_OR_DEPARTMENT_OTHER): Payer: Self-pay | Admitting: Obstetrics & Gynecology

## 2018-03-13 NOTE — Op Note (Signed)
OPERATIVE NOTE  Margaret Roach  DOB:    28-Jun-1985  MRN:    [id number]  CSN:    [id number]  Date of Surgery:  03/01/2018  Preop Diagnosis: Bilateral Ovarian Cysts / possible endometriomas / secondary infertility  Postop Diagnosis: Bilateral Ovarian Cysts   Procedure: 1. Laparoscopic ovarian cystectomy                      2. Chromopertubation                      3. Adhesiolysis  Anesthesia: General   Surgeon: Mady Haagensen. Alwyn Pea, M.D.  Assistant: Earnstine Regal, PA  Disposition: The patient presents with the above-mentioned diagnosis. She understands the indications for surgical procedure.  She also understands the alternative treatment options. She accepts the risk of, but not limited to, anesthetic complications, bleeding, infections, and possible damage to the surrounding organs.  Indication:  32 yo G1P1 with secondary infertility and chronic pelvic pain, dysmenorrhea.  Pelvic ultrasound in the office demonstrates possible bilateral endometriomas.   Findings: Exam under anesthesia: external vulva: normal vaginal vault: normal  Cervix: parous, normal  Uterus: 10 weeks size mobile, no palpable masses, adnexa: fullness left adnexa, no palpable masses bilaterally.  Laparoscopic findings: large 3cm left endometrioma, small sub-centimeter right ovarian cyst.  Bilateral fallopian tube clubbing.  Dense pelvic adhesive disease along the left and right hemipelvis.  [name] disease along liver edge.  Chromopertubation initially demonstrated no flow through both fallopian tubes.  After adhesiolysis, the left fallopian tube spilled dye.   Pathology: Source of Specimen:  left ovarian cyst  Fluids: 1700 mL  UOP: 50 cc straight cath  EBL: 50 mL  Complications: None  Procedure: The patient was taken to the operating room after the risks, benefits, alternatives, complications, treatment options, and expected outcomes were discussed with the patient. The patient verbalized  understanding, the patient concurred with the proposed plan and consent signed and witnessed. The patient was taken to the Operating Room, identified as Margaret Roach and the procedure verified as laparoscopic ovarian cystectomy. A Time Out was held and the above information confirmed.  The patient was taken to the operating room after appropriate identification and placed on the operating table. After the attainment of adequate general anesthesia she was placed in the modified lithotomy position using Allen stirrups. Both upper extremities were padded and placed by her side. An examination under anesthesia was performed.  The abdomen was prepped with ChloraPrep. The perineum and vagina were prepped with multiple layers of Betadine.  The bladder was catheterized. The abdomen and perineum were draped as a sterile field. .  A Zumi uterine manipulator was placed into the endometrial canal. The surgeon re- gowned and gloved.    A subumbilical incision was made using an 11 blade scalpel and the peritoneum was entered directly using a 11 mm Excel Visaport with 0 degree 18mm laparoscope attached. Confirmation of entry into the peritoneum was confirmed with opening pressure of CO2 and direct visualization. Pneumoperitoneum was obtained using approximately 2 L of CO2 gas.  The 10 mm 0 degree laparoscope was introduced and a complete abdominal and pelvic survey was performed with findings noted above. Patient was placed in trendelenburg and marcaine injected in the RLQ and a 5 mm incision was made and 5 mm trocar advanced into the intraabdominal cavity.  The same was done in the LLQ area. Another 5 mm trochar was placed along the left  abdomen to aid surgeon manipulation.  This was performed under direct video visualization. There was no noted injury with placement of any trochars.    The large left ovarian cyst was incised on the antimesenteric side and a combination of blunt and sharp dissection using the  monopolar cautery  to dissect the overlying ovarian cortex off of the underlying cyst. The cyst was entered almost immediately because of the very thin cyst wall, with the egress of brown "chocolate" fluid. The dissection was continued meticulously until the entire cyst could be dissected off of the ovarian cortex with the 45mm Ligasure. It was removed through one of the 30mm trocars and handed off to be sent to pathology. a Monopolar cautery was used assure hemostasis in the ovarian cortex. Copious irrigation was carried out and all operative areas noted to be hemostatic.  The right ovarian cyst was drained of serous fluid. Endometriotic implants along the pouch of Douglas and the uterosacral ligaments were fulgurated with cautery.  The adhesions along the left hemipelvis were dissected.  Chromopertubation was then performed injecting approximately 30 cc of dilute Methylene blue into the Zumi uterine manipulator.  There was no efflux of dye from either tube. Dissection of adhesions along the fimbriated end of the left fallopian tube was done using the endoshear scissors. Chromopertubation was performed again and this time there was efflux of dye from the left tube.   All trochars were then removed from the peritoneal cavity under direct visualization as the CO2 was allowed to escape. The supra-umbilical incision was closed with the fascial sutures in a figure-of-eight fashion. All skin incisions were closed with subcuticular sutures of 3-0 Monocryl then covered with Dermabond.  The uterine manipulator was removed as well as the Foley catheter. The patient was awakened from general anesthesia and taken to the recovery room in satisfactory condition having tolerated the procedure well with sponge and instrument counts correct. It was anticipated that she would be discharged home later that afternoon.  Margaret Roach Margaret Roach

## 2018-03-13 NOTE — Brief Op Note (Signed)
03/01/2018  9:50 PM  PATIENT:  Margaret Roach  32 y.o. female  PRE-OPERATIVE DIAGNOSIS:  Bilateral Ovarian Cysts  POST-OPERATIVE DIAGNOSIS:  Bilateral Ovarian Cysts  PROCEDURE:  Procedure(s): LAPAROSCOPIC OVARIAN CYSTECTOMY, LYSIS OF ADHESIONS (Left) CHROMOPERTUBATION (Bilateral)  SURGEON:  Surgeon(s) and Role:    Sanjuana Kava, MD - Primary  PHYSICIAN ASSISTANT: Earnstine Regal PA   ANESTHESIA:   local and general  EBL:  50 mL   BLOOD ADMINISTERED:none  DRAINS: none   LOCAL MEDICATIONS USED:  MARCAINE     SPECIMEN:  Source of Specimen:  Left ovarian cyst  DISPOSITION OF SPECIMEN:  PATHOLOGY  COUNTS:  YES  TOURNIQUET:  * No tourniquets in log *  DICTATION: .Note written in paper chart  PLAN OF CARE: Discharge to home after PACU  PATIENT DISPOSITION:  PACU - hemodynamically stable.   Delay start of Pharmacological VTE agent (>24hrs) due to surgical blood loss or risk of bleeding: not applicable

## 2018-06-01 ENCOUNTER — Other Ambulatory Visit: Payer: Self-pay | Admitting: Obstetrics & Gynecology

## 2018-10-22 ENCOUNTER — Other Ambulatory Visit: Payer: Self-pay

## 2018-10-22 ENCOUNTER — Emergency Department (HOSPITAL_BASED_OUTPATIENT_CLINIC_OR_DEPARTMENT_OTHER)
Admission: EM | Admit: 2018-10-22 | Discharge: 2018-10-22 | Disposition: A | Discharge status: Home / Self Care | Payer: No Typology Code available for payment source | Attending: Emergency Medicine | Admitting: Emergency Medicine

## 2018-10-22 ENCOUNTER — Emergency Department (HOSPITAL_BASED_OUTPATIENT_CLINIC_OR_DEPARTMENT_OTHER): Payer: No Typology Code available for payment source

## 2018-10-22 ENCOUNTER — Encounter (HOSPITAL_BASED_OUTPATIENT_CLINIC_OR_DEPARTMENT_OTHER): Payer: Self-pay | Admitting: Emergency Medicine

## 2018-10-22 DIAGNOSIS — M79605 Pain in left leg: Secondary | ICD-10-CM

## 2018-10-22 MED ORDER — CYCLOBENZAPRINE HCL 10 MG PO TABS: 10.0000 mg | ORAL_TABLET | Freq: Three times a day (TID) | ORAL | 0 refills | Status: DC | PRN

## 2018-10-22 MED ORDER — DICLOFENAC SODIUM 1 % TD GEL: 2.0000 g | Freq: Four times a day (QID) | TRANSDERMAL | 0 refills | Status: DC | PRN
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
## 2018-10-22 NOTE — ED Notes (Signed)
Pt verbalized understanding of dc instructions.

## 2018-10-22 NOTE — Discharge Instructions (Signed)

## 2019-02-10 ENCOUNTER — Other Ambulatory Visit: Payer: Self-pay

## 2019-03-14 ENCOUNTER — Emergency Department (HOSPITAL_BASED_OUTPATIENT_CLINIC_OR_DEPARTMENT_OTHER)
Admission: EM | Admit: 2019-03-14 | Discharge: 2019-03-14 | Disposition: A | Discharge status: Home / Self Care | Payer: Self-pay | Attending: Emergency Medicine | Admitting: Emergency Medicine

## 2019-03-14 ENCOUNTER — Encounter (HOSPITAL_BASED_OUTPATIENT_CLINIC_OR_DEPARTMENT_OTHER): Payer: Self-pay

## 2019-03-14 ENCOUNTER — Other Ambulatory Visit: Payer: Self-pay

## 2019-03-14 DIAGNOSIS — Y281XXA Contact with knife, undetermined intent, initial encounter: Secondary | ICD-10-CM | POA: Insufficient documentation

## 2019-03-14 DIAGNOSIS — Y999 Unspecified external cause status: Secondary | ICD-10-CM | POA: Insufficient documentation

## 2019-03-14 DIAGNOSIS — Y92000 Kitchen of unspecified non-institutional (private) residence as  the place of occurrence of the external cause: Secondary | ICD-10-CM | POA: Insufficient documentation

## 2019-03-14 DIAGNOSIS — S61211A Laceration without foreign body of left index finger without damage to nail, initial encounter: Secondary | ICD-10-CM | POA: Insufficient documentation

## 2019-03-14 DIAGNOSIS — Y93G1 Activity, food preparation and clean up: Secondary | ICD-10-CM | POA: Insufficient documentation

## 2019-03-14 NOTE — ED Triage Notes (Addendum)
Pt states she cut left index finger on knife ~8pm-pt states she had surgery to finger 10/10 with sutures removed postop last week-semicircular lac noted to tip/pad-no bleeding at this time-2x2/coban dsg placed-NAD-steady gait

## 2019-03-14 NOTE — ED Provider Notes (Signed)
[facility] EMERGENCY DEPARTMENT Provider Note   CSN: [id number] Arrival date & time: 03/14/19  2101     History   Chief Complaint Chief Complaint  Patient presents with  . Finger Injury    HPI Margaret Roach is a 33 y.o. female with a past medical history of ovarian cysts, who presents today for evaluation of a left index finger cut.  She reports that shortly prior to arrival she was cutting a fish when the knife slipped and she cut her left index finger.  Bleeding is controlled at this time.  She reports her tetanus shot is up-to-date.  She recently had hand surgery in a different area on her hand to remove a lump.  She does not take any blood thinning medications.  No other injuries.       HPI  Past Medical History:  Diagnosis Date  . Anemia   . Endometriosis   . Ovarian cyst     There are no active problems to display for this patient.   Past Surgical History:  Procedure Laterality Date  . CHROMOPERTUBATION Bilateral 03/01/2018   Procedure: CHROMOPERTUBATION;  Surgeon: Sanjuana Kava, MD;  Location: [facility];  Service: Gynecology;  Laterality: Bilateral;  . LAPAROSCOPIC OVARIAN CYSTECTOMY    . LAPAROSCOPIC OVARIAN CYSTECTOMY Left 03/01/2018   Procedure: LAPAROSCOPIC OVARIAN CYSTECTOMY, LYSIS OF ADHESIONS;  Surgeon: Sanjuana Kava, MD;  Location: [facility];  Service: Gynecology;  Laterality: Left;     OB History    Gravida  1   Para  1   Term  1   Preterm      AB      Living  1     SAB      TAB      Ectopic      Multiple      Live Births  1            Home Medications    Prior to Admission medications   Medication Sig Start Date End Date Taking? Authorizing Provider  ibuprofen (ADVIL,MOTRIN) 600 MG tablet 1  po  pc every 6 hours for 5 days then prn-post operative pain; first dose 9 p.m. 03/01/18 03/01/18   Earnstine Regal, PA-C  phentermine 37.5 MG capsule Take 37.5 mg by mouth every morning.     [provider]    Family History Family History  Adopted: Yes  Problem Relation Age of Onset  . Cancer Maternal Grandmother     Social History Social History   Tobacco Use  . Smoking status: Never Smoker  . Smokeless tobacco: Never Used  Substance Use Topics  . Alcohol use: Yes    Comment: occ  . Drug use: No     Allergies   Adhesive [tape] and Latex   Review of Systems Review of Systems  Constitutional: Negative for chills and fever.  Musculoskeletal: Negative for back pain.  Skin: Positive for wound. Negative for color change.  All other systems reviewed and are negative.    Physical Exam Updated Vital Signs BP (!) 151/88 (BP Location: Right Arm)   Pulse 90   Temp 98 F (36.7 C) (Oral)   Resp 16   Ht 5\' 3"  (1.6 m)   Wt 75.2 kg   LMP 03/09/2019   SpO2 98%   BMI 29.37 kg/m   Physical Exam Vitals signs and nursing note reviewed.  Constitutional:      General: She is not in acute distress.  Appearance: She is not ill-appearing.  HENT:     Head: Normocephalic.  Cardiovascular:     Rate and Rhythm: Normal rate.  Pulmonary:     Effort: Pulmonary effort is normal. No respiratory distress.  Abdominal:     Tenderness: There is no abdominal tenderness.  Musculoskeletal: Normal range of motion.        General: No swelling or deformity.  Skin:    Comments: Please see clinical image.  There is a 1 cm laceration over the left index finger.  No clear evidence of foreign body.  Wound does not gape open.  Neurological:     Mental Status: She is alert.     Sensory: No sensory deficit.        ED Treatments / Results  Labs (all labs ordered are listed, but only abnormal results are displayed) Labs Reviewed - No data to display  EKG None  Radiology No results found.  Procedures Procedures (including critical care time)  Medications Ordered in ED Medications - No data to display   Initial Impression / Assessment and Plan / ED Course   I have reviewed the triage vital signs and the nursing notes.  Pertinent labs & imaging results that were available during my care of the patient were reviewed by me and considered in my medical decision making (see chart for details).       Patient presents today for evaluation of a cut on her left index finger.  This occurred approximately half an hour prior to arrival when she was cutting fish.  Physical exam shows an approximately 1 to 1.5 cm laceration on the left index finger.  Bleeding is controlled.  We discussed treatment options including x-ray for evaluation of foreign bodies or other abnormalities, and closure options including no closure, Dermabond, and stitches.  We discussed risks and benefits of all these.  Patient made the informed decision to forego x-rays or closure.  Plan to allow for healing naturally given how well aligned the tissue edges are into allow for any bacteria to drain.  She cleaned wound prior to arrival therefore this was not repeated.  Discussed role of p.o. versus topical antibiotics and patient elected for topical antibiotic ointment.  She works in the [employer] and appears reliable for wound observation.  Return precautions were discussed with patient who states their understanding.  At the time of discharge patient denied any unaddressed complaints or concerns.  Patient is agreeable for discharge home.     Final Clinical Impressions(s) / ED Diagnoses   Final diagnoses:  Laceration of left index finger without foreign body without damage to nail, initial encounter    ED Discharge Orders    None       Ollen Gross 03/14/19 2338    Maudie Flakes, MD 03/18/19 [phone]

## 2019-03-14 NOTE — Discharge Instructions (Addendum)
While in the emergency room your blood pressure was slightly elevated.  This is most likely due to pain, and the stress of being in the emergency room.  Please get this rechecked in the next 1 to 2 weeks by your primary care doctor.  Please take Ibuprofen (Advil, motrin) and Tylenol (acetaminophen) to relieve your pain.  You may take up to 600 MG (3 pills) of normal strength ibuprofen every 8 hours as needed.  In between doses of ibuprofen you make take tylenol, up to 1,000 mg (two extra strength pills).  Do not take more than 3,000 mg tylenol in a 24 hour period.  Please check all medication labels as many medications such as pain and cold medications may contain tylenol.  Do not drink alcohol while taking these medications.  Do not take other NSAID'S while taking ibuprofen (such as aleve or naproxen).  Please take ibuprofen with food to decrease stomach upset.  As we discussed peroxide is only good to use if you are trying toget foreign material out of the wound, however if you continue to use it it will kill off the healthy healing tissue.  Please keep your wound clean and dry.  You may put antibiotic ointment on it.  Please do not soak or submerge your wound until it is fully healed.  I would recommend that you try to not get it wet for the first 48 hours.  Today we discussed options including sutures, Dermabond, oral antibiotics.  We discussed risks and benefits of both of these and together made the informed decision to not perform primary closure and to use topical antibiotics.

## 2020-07-31 ENCOUNTER — Other Ambulatory Visit: Payer: Self-pay | Admitting: *Deleted

## 2020-07-31 DIAGNOSIS — N6310 Unspecified lump in the right breast, unspecified quadrant: Secondary | ICD-10-CM

## 2020-08-08 ENCOUNTER — Ambulatory Visit: Payer: Self-pay

## 2020-08-08 ENCOUNTER — Other Ambulatory Visit: Payer: Self-pay

## 2020-08-09 ENCOUNTER — Telehealth: Payer: Self-pay

## 2020-08-09 NOTE — Telephone Encounter (Signed)
Attempted to call patient at mobile number. Voice mailbox not set up, unable to give patient appointment information.

## 2020-08-20 ENCOUNTER — Ambulatory Visit
Admission: RE | Admit: 2020-08-20 | Discharge: 2020-08-20 | Disposition: A | Discharge status: Home / Self Care | Payer: No Typology Code available for payment source | Source: Ambulatory Visit | Attending: Obstetrics and Gynecology | Admitting: Obstetrics and Gynecology

## 2020-08-20 ENCOUNTER — Other Ambulatory Visit: Payer: Self-pay

## 2020-08-20 ENCOUNTER — Ambulatory Visit: Payer: Self-pay | Admitting: *Deleted

## 2020-08-20 VITALS — BP 126/84 | Wt 176.5 lb

## 2020-08-20 DIAGNOSIS — N6310 Unspecified lump in the right breast, unspecified quadrant: Secondary | ICD-10-CM

## 2020-08-20 DIAGNOSIS — N6312 Unspecified lump in the right breast, upper inner quadrant: Secondary | ICD-10-CM

## 2020-08-20 DIAGNOSIS — N644 Mastodynia: Secondary | ICD-10-CM

## 2020-08-20 DIAGNOSIS — Z1239 Encounter for other screening for malignant neoplasm of breast: Secondary | ICD-10-CM

## 2020-08-20 NOTE — Patient Instructions (Signed)
Explained breast self awareness with [name]. Patient did not need a Pap smear today due to last Pap smear and HPV typing was 02/27/2020 per patient. Let her know [facility] will cover Pap smears and HPV typing every 5 years unless has a history of abnormal Pap smears. Referred patient to the [facility] for a diagnostic mammogram. Appointment scheduled Thursday, August 20, 2020 at 1520. Patient aware of appointment and will be there. [name] verbalized understanding.  Miamor Ayler, Arvil Chaco, RN 1:52 PM

## 2020-08-20 NOTE — Progress Notes (Signed)
[name] is a 35 y.o. female who presents to [facility] clinic today with complaint of right breast lump x 2 years that has increased in size over the past month. Patient complained of a constant right breast pain that she rates at a 7.5 out of 10. Patient complained of left breast pain that comes and goes. Patient rated pain at a 4 out of 10. Patient states the pain within her right breast is greater than the left. Patient complained of a right spontaneous clear to milky discharge..    Pap Smear: Pap smear not completed today. Last Pap smear was 02/27/2020 at the [facility] clinic and was normal with negative HPV per patient. Per patient has no history of an abnormal Pap smear. Last Pap smear result is not available in Epic.   Physical exam: Breasts Breasts symmetrical. No skin abnormalities bilateral breasts. No nipple retraction bilateral breasts. No nipple discharge bilateral breasts. Unable to express any nipple discharge on exam. No lymphadenopathy. No lumps palpated left breast. Palpated a lump within the right breast at 1 o'clock 6 cm from the nipple. Complaints of right upper and outer breast pain on exam. Complaints of left upper and inner breast pain on exam.   Pelvic/Bimanual Pap is not indicated today per [facility]CP guidelines.   Smoking History: Patient has never smoked.   Patient Navigation: Patient education provided. Access to services provided for patient through [facility] program.    Breast and Cervical Cancer Risk Assessment: Patient has family history of her mother and maternal grandmother having breast cancer two times. Patient has no known genetic mutations or history of radiation treatment to the chest before age 3. Patient does not have history of cervical dysplasia, immunocompromised, or DES exposure in-utero. Breast cancer risk assessment completed. No breast cancer risk calculated due to patient is less than 2 years old.  Risk Assessment     Risk Scores      08/20/2020   Last edited by: Demetrius Revel, LPN   5-year risk:    Lifetime risk:           A: [facility]CP exam without pap smear Complaint of   P: Referred patient to the [facility] for a diagnostic mammogram. Appointment scheduled Thursday, August 20, 2020 at 1520.  Loletta Parish, RN 08/20/2020 1:52 PM

## 2020-10-21 ENCOUNTER — Emergency Department (HOSPITAL_BASED_OUTPATIENT_CLINIC_OR_DEPARTMENT_OTHER)
Admission: EM | Admit: 2020-10-21 | Discharge: 2020-10-21 | Disposition: A | Discharge status: Home / Self Care | Payer: 59 | Attending: Emergency Medicine | Admitting: Emergency Medicine

## 2020-10-21 ENCOUNTER — Encounter (HOSPITAL_BASED_OUTPATIENT_CLINIC_OR_DEPARTMENT_OTHER): Payer: Self-pay | Admitting: *Deleted

## 2020-10-21 ENCOUNTER — Other Ambulatory Visit: Payer: Self-pay

## 2020-10-21 ENCOUNTER — Emergency Department (HOSPITAL_BASED_OUTPATIENT_CLINIC_OR_DEPARTMENT_OTHER): Payer: 59

## 2020-10-21 DIAGNOSIS — R519 Headache, unspecified: Secondary | ICD-10-CM | POA: Diagnosis not present

## 2020-10-21 DIAGNOSIS — R55 Syncope and collapse: Secondary | ICD-10-CM

## 2020-10-21 DIAGNOSIS — Z91040 Latex allergy status: Secondary | ICD-10-CM | POA: Diagnosis not present

## 2020-10-21 DIAGNOSIS — Z20822 Contact with and (suspected) exposure to covid-19: Secondary | ICD-10-CM | POA: Diagnosis not present

## 2020-10-21 LAB — COMPREHENSIVE METABOLIC PANEL
ALT: 16 U/L (ref 0–44)
AST: 21 U/L (ref 15–41)
Albumin: 4.1 g/dL (ref 3.5–5.0)
Alkaline Phosphatase: 57 U/L (ref 38–126)
Anion gap: 6 (ref 5–15)
BUN: 10 mg/dL (ref 6–20)
CO2: 27 mmol/L (ref 22–32)
Calcium: 9.2 mg/dL (ref 8.9–10.3)
Chloride: 103 mmol/L (ref 98–111)
Creatinine, Ser: 0.77 mg/dL (ref 0.44–1.00)
GFR, Estimated: >60 mL/min (ref >60)
Glucose, Bld: 110 mg/dL — ABNORMAL HIGH (ref 70–99)
Potassium: 3.5 mmol/L (ref 3.5–5.1)
Sodium: 136 mmol/L (ref 135–145)
Total Bilirubin: 0.2 mg/dL — ABNORMAL LOW (ref 0.3–1.2)
Total Protein: 8 g/dL (ref 6.5–8.1)

## 2020-10-21 LAB — CBC WITH DIFFERENTIAL/PLATELET
Abs Immature Granulocytes: 0.01 10*3/uL (ref 0.00–0.07)
Basophils Absolute: 0 10*3/uL (ref 0.0–0.1)
Basophils Relative: 0 %
Eosinophils Absolute: 0.1 10*3/uL (ref 0.0–0.5)
Eosinophils Relative: 1 %
HCT: 34.6 % — ABNORMAL LOW (ref 36.0–46.0)
Hemoglobin: 10.9 g/dL — ABNORMAL LOW (ref 12.0–15.0)
Immature Granulocytes: 0 %
Lymphocytes Relative: 37 %
Lymphs Abs: 2 10*3/uL (ref 0.7–4.0)
MCH: 23.2 pg — ABNORMAL LOW (ref 26.0–34.0)
MCHC: 31.5 g/dL (ref 30.0–36.0)
MCV: 73.6 fL — ABNORMAL LOW (ref 80.0–100.0)
Monocytes Absolute: 0.5 10*3/uL (ref 0.1–1.0)
Monocytes Relative: 8 %
Neutro Abs: 2.9 10*3/uL (ref 1.7–7.7)
Neutrophils Relative %: 54 %
Platelets: 333 10*3/uL (ref 150–400)
RBC: 4.7 MIL/uL (ref 3.87–5.11)
RDW: 17 % — ABNORMAL HIGH (ref 11.5–15.5)
WBC: 5.4 10*3/uL (ref 4.0–10.5)
nRBC: 0 % (ref 0.0–0.2)

## 2020-10-21 LAB — URINALYSIS, MICROSCOPIC (REFLEX): RBC / HPF: NONE SEEN RBC/hpf (ref 0–5)

## 2020-10-21 LAB — URINALYSIS, ROUTINE W REFLEX MICROSCOPIC
Bilirubin Urine: NEGATIVE
Glucose, UA: NEGATIVE mg/dL
Hgb urine dipstick: NEGATIVE
Ketones, ur: NEGATIVE mg/dL
Nitrite: NEGATIVE
Protein, ur: NEGATIVE mg/dL
Specific Gravity, Urine: 1.02 (ref 1.005–1.030)
pH: 6 (ref 5.0–8.0)

## 2020-10-21 LAB — PREGNANCY, URINE: Preg Test, Ur: NEGATIVE

## 2020-10-21 NOTE — ED Notes (Signed)
Patient transported to CT 

## 2020-10-21 NOTE — ED Triage Notes (Signed)
C/o near syncopal episodes x 2 weeks

## 2020-10-21 NOTE — ED Provider Notes (Signed)
[facility] EMERGENCY DEPARTMENT Provider Note   CSN: [id number] Arrival date & time: 10/21/20  1708     History Chief Complaint  Patient presents with   Near Syncope    Margaret Roach is a 35 y.o. female.  HPI     Presents with concern for syncope Headaches began last Sunday, coming and going, feels like a pressure, 7.5/10, better laying down, worse standing up and being active. Off an on during the day for 10hr, every 20 minutes will feel it Started jumproping lifting 5-8lb weights, at first thought dizzy Vomited 3 times last week and twice today Adopted and doesn't know family history  June 1-5 menses, heavy When sneeze for 6 mos right lower arm numb Head feels numb Bright lights or loud sounds don't make it worse, working, moving makes it worse W in kitchen then began to feel woozy, leaned over, fett better, then after got up, and a little bit latera had another episode of lightheadedness which was more persistent for about 30 minutes. Last week passed out after getting out of the shower, felt lightheaded No OCP/immobilization/leg pain/swelling, last surgery 2020  Stopped hctz last week Started phenteramine 3 days ago however symptoms started before this and noted syncope was last week  Past Medical History:  Diagnosis Date   Anemia    Endometriosis    Ovarian cyst     There are no problems to display for this patient.   Past Surgical History:  Procedure Laterality Date   CHROMOPERTUBATION Bilateral 03/01/2018   Procedure: CHROMOPERTUBATION;  Surgeon: Sanjuana Kava, MD;  [facility]: [facility];  Service: Gynecology;  Laterality: Bilateral;   LAPAROSCOPIC OVARIAN CYSTECTOMY     LAPAROSCOPIC OVARIAN CYSTECTOMY Left 03/01/2018   Procedure: LAPAROSCOPIC OVARIAN CYSTECTOMY, LYSIS OF ADHESIONS;  Surgeon: Sanjuana Kava, MD;  Location: [facility];  Service: Gynecology;  Laterality: Left;     OB History     Gravida  1   Para   1   Term  1   Preterm      AB      Living  1      SAB      IAB      Ectopic      Multiple      Live Births  1           Family History  Adopted: Yes  Problem Relation Age of Onset   Breast cancer Maternal Grandmother        age 96   Heart disease Mother    Diverticulitis Mother    Diabetes Mother    Hypertension Mother    Hypertension Sister    Breast cancer Maternal Great-grandmother     Social History   Tobacco Use   Smoking status: Never   Smokeless tobacco: Never  Vaping Use   Vaping Use: Never used  Substance Use Topics   Alcohol use: Yes    Comment: occ   Drug use: No    Home Medications Prior to Admission medications   Medication Sig Start Date End Date Taking? Authorizing Provider  busPIRone (BUSPAR) 7.5 MG tablet Take 7.5 mg by mouth as needed.    [provider]  ibuprofen (ADVIL,MOTRIN) 600 MG tablet 1  po  pc every 6 hours for 5 days then prn-post operative pain; first dose 9 p.m. 03/01/18 03/01/18   Earnstine Regal, PA-C  Multiple Vitamin (MULTIVITAMIN) tablet Take 1 tablet by mouth daily.    [provider]  phentermine 37.5 MG capsule Take 37.5 mg by mouth every morning.    [provider]    Allergies    Adhesive [tape] and Latex  Review of Systems   Review of Systems  Constitutional:  Negative for fever.  HENT:  Negative for sore throat.   Eyes:  Negative for visual disturbance.  Respiratory:  Negative for cough and shortness of breath.   Cardiovascular:  Positive for chest pain (occasional sharp pain pleurisy, feels it with moving). Negative for leg swelling.  Gastrointestinal:  Positive for nausea and vomiting. Negative for abdominal pain, constipation and diarrhea (3 times a day, taking medication causing it).  Genitourinary:  Negative for difficulty urinating.  Musculoskeletal:  Negative for back pain and neck pain.  Skin:  Negative for rash.  Neurological:  Positive for light-headedness and  headaches. Negative for syncope, speech difficulty, weakness and numbness.   Physical Exam Updated Vital Signs BP 129/84 (BP Location: Left Arm)   Pulse 75   Temp 98.2 F (36.8 C) (Oral)   Resp 19   Ht 5\' 3"  (1.6 m)   Wt 81.6 kg   LMP 10/02/2020   SpO2 100%   BMI 31.89 kg/m   Physical Exam Constitutional:      General: She is not in acute distress.    Appearance: Normal appearance. She is not ill-appearing.  HENT:     Head: Normocephalic and atraumatic.  Eyes:     General: No visual field deficit.    Extraocular Movements: Extraocular movements intact.     Conjunctiva/sclera: Conjunctivae normal.     Pupils: Pupils are equal, round, and reactive to light.  Cardiovascular:     Rate and Rhythm: Normal rate and regular rhythm.     Pulses: Normal pulses.  Pulmonary:     Effort: Pulmonary effort is normal. No respiratory distress.  Musculoskeletal:        General: No swelling or tenderness.     Cervical back: Normal range of motion.  Skin:    General: Skin is warm and dry.     Findings: No erythema or rash.  Neurological:     General: No focal deficit present.     Mental Status: She is alert and oriented to person, place, and time.     GCS: GCS eye subscore is 4. GCS verbal subscore is 5. GCS motor subscore is 6.     Cranial Nerves: No cranial nerve deficit, dysarthria or facial asymmetry.     Sensory: No sensory deficit.     Motor: No weakness or tremor.     Coordination: Coordination normal. Finger-Nose-Finger Test normal.     Gait: Gait normal.    ED Results / Procedures / Treatments   Labs (all labs ordered are listed, but only abnormal results are displayed) Labs Reviewed  CBC WITH DIFFERENTIAL/PLATELET - Abnormal; Notable for the following components:      Result Value   Hemoglobin 10.9 (*)    HCT 34.6 (*)    MCV 73.6 (*)    MCH 23.2 (*)    RDW 17.0 (*)    All other components within normal limits  COMPREHENSIVE METABOLIC PANEL - Abnormal; Notable for the  following components:   Glucose, Bld 110 (*)    Total Bilirubin 0.2 (*)    All other components within normal limits  URINALYSIS, ROUTINE W REFLEX MICROSCOPIC - Abnormal; Notable for the following components:   APPearance CLOUDY (*)    Leukocytes,Ua MODERATE (*)  All other components within normal limits  URINALYSIS, MICROSCOPIC (REFLEX) - Abnormal; Notable for the following components:   Bacteria, UA RARE (*)    All other components within normal limits  SARS CORONAVIRUS 2 (TAT 6-24 HRS)  PREGNANCY, URINE    EKG EKG Interpretation  Date/Time:  Monday October 21 2020 17:55:55 EDT Ventricular Rate:  81 PR Interval:  150 QRS Duration: 92 QT Interval:  375 QTC Calculation: 436 R Axis:   -83 Text Interpretation: Sinus rhythm Left anterior fascicular block No significant change since last tracing Confirmed by Gareth Morgan [phone]) on 10/21/2020 6:37:41 PM Also confirmed by Gareth Morgan [phone]), editor Mariam Dollar [phone])  on 10/22/2020 8:37:02 AM  Radiology CT Head Wo Contrast  Result Date: 10/21/2020 CLINICAL DATA:  Headache. Near syncope spells over the last 2 weeks. EXAM: CT HEAD WITHOUT CONTRAST TECHNIQUE: Contiguous axial images were obtained from the base of the skull through the vertex without intravenous contrast. COMPARISON:  07/08/2009 FINDINGS: Brain: No evidence of acute infarction, hemorrhage, hydrocephalus, extra-axial collection or mass lesion/mass effect. Vascular: No hyperdense vessel or unexpected calcification. Skull: Normal. Negative for fracture or focal lesion. Sinuses/Orbits: Globes and orbits are unremarkable. Small mucous retention cyst in the anterior right sphenoid sinus. Remaining visualized sinuses are clear. Other: None. IMPRESSION: 1. No intracranial abnormality. Electronically Signed   By: Lajean Manes M.D.   On: 10/21/2020 20:23    Procedures Procedures   Medications Ordered in ED Medications - No data to display  ED Course  I have reviewed the  triage vital signs and the nursing notes.  Pertinent labs & imaging results that were available during my care of the patient were reviewed by me and considered in my medical decision making (see chart for details).    MDM Rules/Calculators/A&P                          35yo female presents with concern for near-syncope and headaches.  No sign of arrhythmia while in the ED, ECG WNL. Labs without significant anemia or electrolyte abnormality.  Pregnancy test negative. Normal neurologic exam. Given severity of headaches CT head completed showing no ICH, no other significant abnormalities. Doubt occult SAH or indication for lumbar puncture. Recommend PCP follow up for continued care.      Final Clinical Impression(s) / ED Diagnoses Final diagnoses:  Near syncope  Frequent headaches    Rx / DC Orders ED Discharge Orders     None        Gareth Morgan, MD 10/22/20 1335

## 2020-10-21 NOTE — ED Notes (Signed)
Gave patient ice and gingerale.

## 2020-10-22 LAB — SARS CORONAVIRUS 2 (TAT 6-24 HRS): SARS Coronavirus 2: NEGATIVE

## 2020-11-06 ENCOUNTER — Other Ambulatory Visit: Payer: Self-pay | Admitting: Internal Medicine

## 2020-11-06 DIAGNOSIS — E7800 Pure hypercholesterolemia, unspecified: Secondary | ICD-10-CM

## 2020-11-07 ENCOUNTER — Other Ambulatory Visit: Payer: Self-pay | Admitting: Student in an Organized Health Care Education/Training Program

## 2020-11-07 ENCOUNTER — Other Ambulatory Visit: Payer: Self-pay

## 2020-11-07 ENCOUNTER — Ambulatory Visit
Admission: RE | Admit: 2020-11-07 | Discharge: 2020-11-07 | Disposition: A | Discharge status: Home / Self Care | Payer: 59 | Source: Ambulatory Visit | Attending: Student in an Organized Health Care Education/Training Program | Admitting: Student in an Organized Health Care Education/Training Program

## 2020-11-07 DIAGNOSIS — R109 Unspecified abdominal pain: Secondary | ICD-10-CM

## 2020-11-07 DIAGNOSIS — R112 Nausea with vomiting, unspecified: Secondary | ICD-10-CM

## 2020-11-10 ENCOUNTER — Emergency Department (HOSPITAL_COMMUNITY)
Admission: EM | Admit: 2020-11-10 | Discharge: 2020-11-11 | Disposition: A | Discharge status: Home / Self Care | Payer: 59 | Attending: Emergency Medicine | Admitting: Emergency Medicine

## 2020-11-10 ENCOUNTER — Other Ambulatory Visit: Payer: Self-pay

## 2020-11-10 ENCOUNTER — Emergency Department (HOSPITAL_COMMUNITY): Payer: 59

## 2020-11-10 DIAGNOSIS — R1010 Upper abdominal pain, unspecified: Secondary | ICD-10-CM | POA: Diagnosis not present

## 2020-11-10 DIAGNOSIS — R0781 Pleurodynia: Secondary | ICD-10-CM | POA: Diagnosis present

## 2020-11-10 DIAGNOSIS — Z5321 Procedure and treatment not carried out due to patient leaving prior to being seen by health care provider: Secondary | ICD-10-CM | POA: Insufficient documentation

## 2020-11-10 DIAGNOSIS — R0602 Shortness of breath: Secondary | ICD-10-CM | POA: Insufficient documentation

## 2020-11-10 DIAGNOSIS — Z888 Allergy status to other drugs, medicaments and biological substances status: Secondary | ICD-10-CM | POA: Diagnosis not present

## 2020-11-10 NOTE — ED Triage Notes (Signed)
Pt via POV. Referred by PCP concerns regarding CP/SOB. Denies any recent injuries. Arrives clutching left side near ridge cage pain and has tenderness to Left abdomen. Pain worsens with breathing and movement.

## 2020-11-10 NOTE — ED Provider Notes (Signed)
Emergency Medicine Provider Triage Evaluation Note  Margaret Roach , a 35 y.o. female  was evaluated in triage.  Pt complains of left sided chest/rib/upper abdominal pain.  Gradually worsening over the past week.  Worse with activity and breathing.  No hx of PE.  States it feels similar to costochondritis, which she has before.  Review of Systems  Positive: CP, SOB, abdominal pain Negative: Fever, cough, injury  Physical Exam  BP (!) 152/106 (BP Location: Right Arm)   Pulse 77   Temp 98.4 F (36.9 C) (Oral)   Resp 18   Ht 5\' 3"  (1.6 m)   Wt 81.6 kg   SpO2 100%   BMI 31.89 kg/m  Gen:   Awake, no distress   Resp:  Normal effort  MSK:   Moves extremities without difficulty  Other:  TTP in left upper abdomen/inferior chest wall  Medical Decision Making  Medically screening exam initiated at 11:35 PM.  Appropriate orders placed.  Margaret Roach was informed that the remainder of the evaluation will be completed by another provider, this initial triage assessment does not replace that evaluation, and the importance of remaining in the ED until their evaluation is complete.     Montine Circle, PA-C 11/10/20 2337    [name], MD 11/11/20 [phone]

## 2020-11-11 ENCOUNTER — Emergency Department (HOSPITAL_BASED_OUTPATIENT_CLINIC_OR_DEPARTMENT_OTHER): Payer: 59

## 2020-11-11 ENCOUNTER — Other Ambulatory Visit: Payer: Self-pay

## 2020-11-11 ENCOUNTER — Encounter (HOSPITAL_BASED_OUTPATIENT_CLINIC_OR_DEPARTMENT_OTHER): Payer: Self-pay

## 2020-11-11 ENCOUNTER — Emergency Department (HOSPITAL_BASED_OUTPATIENT_CLINIC_OR_DEPARTMENT_OTHER)
Admission: EM | Admit: 2020-11-11 | Discharge: 2020-11-11 | Disposition: A | Discharge status: Home / Self Care | Payer: 59 | Source: Home / Self Care | Attending: Emergency Medicine | Admitting: Emergency Medicine

## 2020-11-11 DIAGNOSIS — R091 Pleurisy: Secondary | ICD-10-CM | POA: Insufficient documentation

## 2020-11-11 DIAGNOSIS — R1012 Left upper quadrant pain: Secondary | ICD-10-CM | POA: Insufficient documentation

## 2020-11-11 DIAGNOSIS — K5900 Constipation, unspecified: Secondary | ICD-10-CM

## 2020-11-11 DIAGNOSIS — R0789 Other chest pain: Secondary | ICD-10-CM | POA: Insufficient documentation

## 2020-11-11 DIAGNOSIS — Z91040 Latex allergy status: Secondary | ICD-10-CM | POA: Insufficient documentation

## 2020-11-11 LAB — CBC WITH DIFFERENTIAL/PLATELET
Abs Immature Granulocytes: 0.01 10*3/uL (ref 0.00–0.07)
Basophils Absolute: 0 10*3/uL (ref 0.0–0.1)
Basophils Relative: 0 %
Eosinophils Absolute: 0.1 10*3/uL (ref 0.0–0.5)
Eosinophils Relative: 1 %
HCT: 33 % — ABNORMAL LOW (ref 36.0–46.0)
Hemoglobin: 9.9 g/dL — ABNORMAL LOW (ref 12.0–15.0)
Immature Granulocytes: 0 %
Lymphocytes Relative: 37 %
Lymphs Abs: 2.2 10*3/uL (ref 0.7–4.0)
MCH: 22.8 pg — ABNORMAL LOW (ref 26.0–34.0)
MCHC: 30 g/dL (ref 30.0–36.0)
MCV: 76 fL — ABNORMAL LOW (ref 80.0–100.0)
Monocytes Absolute: 0.5 10*3/uL (ref 0.1–1.0)
Monocytes Relative: 9 %
Neutro Abs: 3.1 10*3/uL (ref 1.7–7.7)
Neutrophils Relative %: 53 %
Platelets: 348 10*3/uL (ref 150–400)
RBC: 4.34 MIL/uL (ref 3.87–5.11)
RDW: 17.2 % — ABNORMAL HIGH (ref 11.5–15.5)
WBC: 6 10*3/uL (ref 4.0–10.5)
nRBC: 0 % (ref 0.0–0.2)

## 2020-11-11 LAB — COMPREHENSIVE METABOLIC PANEL
ALT: 11 U/L (ref 0–44)
AST: 17 U/L (ref 15–41)
Albumin: 3.7 g/dL (ref 3.5–5.0)
Alkaline Phosphatase: 51 U/L (ref 38–126)
Anion gap: 8 (ref 5–15)
BUN: 13 mg/dL (ref 6–20)
CO2: 24 mmol/L (ref 22–32)
Calcium: 9.4 mg/dL (ref 8.9–10.3)
Chloride: 105 mmol/L (ref 98–111)
Creatinine, Ser: 0.85 mg/dL (ref 0.44–1.00)
GFR, Estimated: >60 mL/min (ref >60)
Glucose, Bld: 93 mg/dL (ref 70–99)
Potassium: 3.9 mmol/L (ref 3.5–5.1)
Sodium: 137 mmol/L (ref 135–145)
Total Bilirubin: 0.4 mg/dL (ref 0.3–1.2)
Total Protein: 6.9 g/dL (ref 6.5–8.1)

## 2020-11-11 LAB — URINALYSIS, ROUTINE W REFLEX MICROSCOPIC
Bilirubin Urine: NEGATIVE
Glucose, UA: NEGATIVE mg/dL
Hgb urine dipstick: NEGATIVE
Ketones, ur: NEGATIVE mg/dL
Nitrite: NEGATIVE
Protein, ur: NEGATIVE mg/dL
Specific Gravity, Urine: 1.012 (ref 1.005–1.030)
pH: 6.5 (ref 5.0–8.0)

## 2020-11-11 LAB — I-STAT BETA HCG BLOOD, ED (MC, WL, AP ONLY): I-stat hCG, quantitative: <5 m[IU]/mL (ref <5)

## 2020-11-11 LAB — LIPASE, BLOOD: Lipase: 37 U/L (ref 11–51)

## 2020-11-11 LAB — PREGNANCY, URINE: Preg Test, Ur: NEGATIVE

## 2020-11-11 LAB — D-DIMER, QUANTITATIVE: D-Dimer, Quant: <0.27 ug/mL-FEU (ref 0.00–0.50)

## 2020-11-11 MED ORDER — KETOROLAC TROMETHAMINE 30 MG/ML IJ SOLN
30.0000 mg | Freq: Once | INTRAMUSCULAR | Status: AC
  Administered 2020-11-11: 30 mg via INTRAVENOUS
  Filled 2020-11-11: qty 1

## 2020-11-11 MED ORDER — ONDANSETRON HCL 4 MG/2ML IJ SOLN
4.0000 mg | Freq: Once | INTRAMUSCULAR | Status: AC
  Administered 2020-11-11: 4 mg via INTRAVENOUS
  Filled 2020-11-11: qty 2

## 2020-11-11 MED ORDER — MORPHINE SULFATE (PF) 4 MG/ML IV SOLN
4.0000 mg | Freq: Once | INTRAVENOUS | Status: AC
  Administered 2020-11-11: 4 mg via INTRAVENOUS
  Filled 2020-11-11: qty 1

## 2020-11-11 MED ORDER — IOHEXOL 300 MG/ML  SOLN
100.0000 mL | Freq: Once | INTRAMUSCULAR | Status: AC | PRN
  Administered 2020-11-11: 100 mL via INTRAVENOUS

## 2020-11-11 NOTE — ED Provider Notes (Signed)
[facility] EMERGENCY DEPT Provider Note   CSN: [id number] Arrival date & time: 11/11/20  0101     History Chief Complaint  Patient presents with   Pleurisy    Margaret Roach is a 35 y.o. female.  HPI     This is a 35 year old female with a history of endometriosis, anemia who presents with persistent left upper quadrant pain.  Patient reports several week history of waxing and waning left upper quadrant pain and lower chest pain.  She states she is also had daily nausea and vomiting.  For the last 2 weeks she has not had consistent bowel movements and feels constipated.  Patient reports that the pain is now causing shortness of breath.  She has had similar symptoms in the past and has been diagnosed with pleurisy.  Pain is worse when she walks down stairs or is in a sitting position.  She has taken ibuprofen and attempted MiraLAX for constipation although only once with no improvement of her symptoms.  She is not had any recent fevers.  No urinary symptoms.  Denies trauma.  Patient rates her pain at "12 out of 10."  Patient had a medical screening exam at [facility].  Reviewed her work-up from there.  Labs are largely reassuring including D-dimer.  Chest x-ray without pneumothorax or pneumonia.  Past Medical History:  Diagnosis Date   Anemia    Endometriosis    Ovarian cyst     There are no problems to display for this patient.   Past Surgical History:  Procedure Laterality Date   CHROMOPERTUBATION Bilateral 03/01/2018   Procedure: CHROMOPERTUBATION;  Surgeon: Sanjuana Kava, MD;  Location: [facility];  Service: Gynecology;  Laterality: Bilateral;   LAPAROSCOPIC OVARIAN CYSTECTOMY     LAPAROSCOPIC OVARIAN CYSTECTOMY Left 03/01/2018   Procedure: LAPAROSCOPIC OVARIAN CYSTECTOMY, LYSIS OF ADHESIONS;  Surgeon: Sanjuana Kava, MD;  Location: [place];  Service: Gynecology;  Laterality: Left;     OB History     Gravida  1    Para  1   Term  1   Preterm      AB      Living  1      SAB      IAB      Ectopic      Multiple      Live Births  1           Family History  Adopted: Yes  Problem Relation Age of Onset   Breast cancer Maternal Grandmother        age 43   Heart disease Mother    Diverticulitis Mother    Diabetes Mother    Hypertension Mother    Hypertension Sister    Breast cancer Maternal Great-grandmother     Social History   Tobacco Use   Smoking status: Never   Smokeless tobacco: Never  Vaping Use   Vaping Use: Never used  Substance Use Topics   Alcohol use: Yes    Comment: occ   Drug use: No    Home Medications Prior to Admission medications   Medication Sig Start Date End Date Taking? Authorizing Provider  busPIRone (BUSPAR) 7.5 MG tablet Take 7.5 mg by mouth as needed.    [provider]  ibuprofen (ADVIL,MOTRIN) 600 MG tablet 1  po  pc every 6 hours for 5 days then prn-post operative pain; first dose 9 p.m. 03/01/18 03/01/18   Earnstine Regal, PA-C  Multiple Vitamin (  MULTIVITAMIN) tablet Take 1 tablet by mouth daily.    [provider]  phentermine 37.5 MG capsule Take 37.5 mg by mouth every morning.    [provider]    Allergies    Adhesive [tape] and Latex  Review of Systems   Review of Systems  Constitutional:  Negative for fever.  Respiratory:  Positive for shortness of breath.   Cardiovascular:  Negative for chest pain and leg swelling.  Gastrointestinal:  Positive for abdominal pain, constipation, nausea and vomiting.  Genitourinary:  Negative for dysuria.  All other systems reviewed and are negative.  Physical Exam Updated Vital Signs BP (!) 133/99 (BP Location: Right Arm)   Pulse 83   Temp 98.1 F (36.7 C) (Oral)   Resp 17   Ht 1.6 m (5\' 3" )   Wt 80.7 kg   LMP 10/31/2020 (Exact Date)   SpO2 100%   BMI 31.53 kg/m   Physical Exam Vitals and nursing note reviewed.  Constitutional:      Appearance:  She is well-developed. She is not ill-appearing.  HENT:     Head: Normocephalic and atraumatic.     Nose: Nose normal.     Mouth/Throat:     Mouth: Mucous membranes are moist.  Eyes:     Pupils: Pupils are equal, round, and reactive to light.  Cardiovascular:     Rate and Rhythm: Normal rate and regular rhythm.     Heart sounds: Normal heart sounds.  Pulmonary:     Effort: Pulmonary effort is normal. No respiratory distress.     Breath sounds: No wheezing.  Abdominal:     General: Bowel sounds are normal.     Palpations: Abdomen is soft.     Tenderness: There is abdominal tenderness. There is no right CVA tenderness or left CVA tenderness.     Comments: Left upper quadrant tenderness to palpation, no rebound or guarding  Musculoskeletal:     Cervical back: Neck supple.  Skin:    General: Skin is warm and dry.  Neurological:     Mental Status: She is alert and oriented to person, place, and time.  Psychiatric:        Mood and Affect: Mood normal.    ED Results / Procedures / Treatments   Labs (all labs ordered are listed, but only abnormal results are displayed) Labs Reviewed  URINALYSIS, ROUTINE W REFLEX MICROSCOPIC - Abnormal; Notable for the following components:      Result Value   Color, Urine COLORLESS (*)    Leukocytes,Ua TRACE (*)    All other components within normal limits  PREGNANCY, URINE    EKG None  Radiology DG Chest 2 View  Result Date: 11/11/2020 CLINICAL DATA:  Chest pain EXAM: CHEST - 2 VIEW COMPARISON:  None. FINDINGS: The heart size and mediastinal contours are within normal limits. No focal consolidation. No pleural effusion. No pneumothorax. The visualized skeletal structures are unremarkable. IMPRESSION: No active cardiopulmonary disease. Electronically Signed   By: Dahlia Bailiff MD   On: 11/11/2020 00:00   CT ABDOMEN PELVIS W CONTRAST  Result Date: 11/11/2020 CLINICAL DATA:  Left-sided abdominal pain for 2 weeks EXAM: CT ABDOMEN AND PELVIS  WITH CONTRAST TECHNIQUE: Multidetector CT imaging of the abdomen and pelvis was performed using the standard protocol following bolus administration of intravenous contrast. CONTRAST:  120mL OMNIPAQUE IOHEXOL 300 MG/ML  SOLN COMPARISON:  None. FINDINGS: Lower chest: No acute abnormality. Hepatobiliary: No focal liver abnormality is seen. No gallstones, gallbladder wall thickening,  or biliary dilatation. Pancreas: Unremarkable. No pancreatic ductal dilatation or surrounding inflammatory changes. Spleen: Normal in size without focal abnormality. Adrenals/Urinary Tract: Adrenal glands are within normal limits. Kidneys are well visualized within normal enhancement pattern bilaterally. No renal calculi or obstructive changes are seen. Ureters are within normal limits. Bladder is decompressed. Stomach/Bowel: Colon shows no obstructive or inflammatory changes. The appendix is within normal limits. Small bowel and stomach are unremarkable. Vascular/Lymphatic: No significant vascular findings are present. No enlarged abdominal or pelvic lymph nodes. Reproductive: Uterus demonstrates prominent endometrium likely related to current menstrual status. Some irregular masslike densities are noted with decreased enhancement consistent with uterine fibroids. Largest of these measures approximately 4.6 cm in greatest dimension. Simple appearing ovarian cysts are noted bilaterally. Right ovarian cyst measures 3.2 cm. Left cyst measures 2.2 cm. Other: No abdominal wall hernia or abnormality. No abdominopelvic ascites. Musculoskeletal: No acute or significant osseous findings. IMPRESSION: Changes consistent with fibroid uterus. Bilateral simple appearing ovarian cysts are noted. No follow-up imaging recommended. Note: This recommendation does not apply to premenarchal patients and to those with increased risk (genetic, family history, elevated tumor markers or other high-risk factors) of ovarian cancer. Reference: JACR 2020 Feb;  17(2):248-254 No other focal abnormality is noted. Electronically Signed   By: Inez Catalina M.D.   On: 11/11/2020 02:34    Procedures Procedures   Medications Ordered in ED Medications  morphine 4 MG/ML injection 4 mg (4 mg Intravenous Given 11/11/20 0139)  ondansetron (ZOFRAN) injection 4 mg (4 mg Intravenous Given 11/11/20 0139)  iohexol (OMNIPAQUE) 300 MG/ML solution 100 mL (100 mLs Intravenous Contrast Given 11/11/20 0210)  ketorolac (TORADOL) 30 MG/ML injection 30 mg (30 mg Intravenous Given 11/11/20 0246)    ED Course  I have reviewed the triage vital signs and the nursing notes.  Pertinent labs & imaging results that were available during my care of the patient were reviewed by me and considered in my medical decision making (see chart for details).    MDM Rules/Calculators/A&P                        Final Clinical Impression(s) / ED Diagnoses Final diagnoses:  LUQ pain  Constipation, unspecified constipation type   Patient presents with left upper quadrant, lower chest pain.  She is overall nontoxic and vital signs are reassuring.  She has some tenderness to palpation in the left upper quadrant.  Recent lab work at [facility] is reassuring including D-dimer to rule stratify for PE. Other considerations include but not limited to, urinary pathology, obstruction, constipation.  Pneumonia, pneumothorax.  Chest x-ray not suggestive of pneumonia or pneumothorax.  Patient was sent for CT scan to further evaluate intra-abdominal pathology.  This is largely reassuring.  She does have bilateral ovarian cyst and a fibroid uterus.  Presentation not consistent with ovarian torsion.  Discussed increasing bowel regimen to improve constipation.  We will have her follow-up with her primary physician.  May need GI referral.  After history, exam, and medical workup I feel the patient has been appropriately medically screened and is safe for discharge home. Pertinent diagnoses were discussed with the  patient. Patient was given return precautions.   Rx / DC Orders ED Discharge Orders     None        Makenzey Nanni, Barbette Hair, MD 11/11/20 [phone]

## 2020-11-11 NOTE — ED Triage Notes (Addendum)
Pt is present for left sided rib cage pain with occasional SOB x two weeks that has progressively worsened. Pt states, "this feels like pleurisy and I have had it before". Sx are worse when she walks down the stairs, sitting, driving or any "sitting position". No BM x two weeks and has finished a bottle of miralax. Denies current nausea/vomiting or chest pain. Pt is gripping onto left side for comfort measures.   Pt had blood work and xray done at [facility].

## 2020-11-11 NOTE — ED Notes (Signed)
Pt verbalizes understanding of discharge instructions. Opportunity for questioning and answers were provided. Armand removed by staff, pt discharged from ED to home.  

## 2020-11-11 NOTE — Discharge Instructions (Addendum)
You were seen today for persistent left upper quadrant pain.  Your work-up is largely reassuring.  You do have bilateral simple ovarian cysts and a fibroid uterus or your CT scan.  Given your recent constipation, increase MiraLAX to once daily.  You may need referral to gastroenterology should your symptoms persist.

## 2020-12-17 ENCOUNTER — Ambulatory Visit: Payer: 59 | Admitting: Gastroenterology

## 2020-12-17 ENCOUNTER — Encounter: Payer: Self-pay | Admitting: Gastroenterology

## 2020-12-17 VITALS — BP 138/90 | HR 78 | Ht 63.0 in | Wt 179.8 lb

## 2020-12-17 DIAGNOSIS — K5900 Constipation, unspecified: Secondary | ICD-10-CM | POA: Diagnosis not present

## 2020-12-17 DIAGNOSIS — R112 Nausea with vomiting, unspecified: Secondary | ICD-10-CM | POA: Diagnosis not present

## 2020-12-17 DIAGNOSIS — R1012 Left upper quadrant pain: Secondary | ICD-10-CM | POA: Diagnosis not present

## 2020-12-17 MED ORDER — OMEPRAZOLE 40 MG PO CPDR: 40.0000 mg | DELAYED_RELEASE_CAPSULE | Freq: Every day | ORAL | 3 refills | Status: AC

## 2020-12-17 MED ORDER — SUPREP BOWEL PREP KIT 17.5-3.13-1.6 GM/177ML PO SOLN: ORAL | 0 refills | Status: DC

## 2020-12-17 MED ORDER — POLYETHYLENE GLYCOL 3350 17 G PO PACK: 34.0000 g | PACK | Freq: Every day | ORAL | 0 refills | Status: AC

## 2020-12-17 NOTE — Progress Notes (Signed)
HPI: This is a very pleasant 35 year old woman who was referred to me by Joycelyn Man, FNP  to evaluate Abdominal pains, change in bowel habits, constipation, vomiting.    For much of her life she has been moving her bowels once a day to twice a day.  The past 6 to 8 weeks she has noticed a significant change.  She is going once a week and only with a lot of effort pushing and straining.  She has tried MiraLAX 1 dose once daily and this did not seem to help at all.  She cannot point to any specific medicines or change in lifestyle around this bowel habit change.  She did notice that the same times as her bowels changed she began to have vomiting after many meals.  Not every meal but most meals.  She has no hematemesis.  Remarkably with all this vomiting she has gained 20 pounds in the past 6 months or so.  She takes 600 mg of ibuprofen every day for endometriosis, lower abdominal pains.  She has also had a left flank, left upper quadrant pain that is significantly positional.  Sitting up and laying back twisting her abdomen all cause this pain to be from point worse.  She has had no overt GI bleeding.  She thinks a grandparent may have had colon cancer but no other family members.  Old Data Reviewed: She underwent a laparoscopic gynecologic procedure October 2019 with Dr. Sanjuana Kava.  Findings included "dense pelvic adhesive disease along the left and right hemipelvis" endometriosis implants were noted.  A lysis of adhesions was performed, chromopertubation and laparoscopic ovarian cystectomy.  CT scan abdomen pelvis with IV and oral contrast July 2022, indication "left-sided abdominal pain for 2 weeks" findings fibroid uterus.  Simple appearing ovarian cysts.  No other focal abnormalities noted.  Blood work July 2022 CBC showed hemoglobin 9.9, MCV 76, platelets 348, complete metabolic profile was normal, lipase was normal,   #3 side effect of BuSpar is nausea    Review of  systems: Pertinent positive and negative review of systems were noted in the above HPI section. All other review negative.   Past Medical History:  Diagnosis Date   Anemia    Endometriosis    Ovarian cyst     Past Surgical History:  Procedure Laterality Date   CHROMOPERTUBATION Bilateral 03/01/2018   Procedure: CHROMOPERTUBATION;  Surgeon: Sanjuana Kava, MD;  Location: [facility];  Service: Gynecology;  Laterality: Bilateral;   LAPAROSCOPIC OVARIAN CYSTECTOMY     LAPAROSCOPIC OVARIAN CYSTECTOMY Left 03/01/2018   Procedure: LAPAROSCOPIC OVARIAN CYSTECTOMY, LYSIS OF ADHESIONS;  Surgeon: Sanjuana Kava, MD;  Location: [facility];  Service: Gynecology;  Laterality: Left;    Current Outpatient Medications  Medication Sig Dispense Refill   ibuprofen (ADVIL,MOTRIN) 600 MG tablet 1  po  pc every 6 hours for 5 days then prn-post operative pain; first dose 9 p.m. 03/01/18 30 tablet 0   Multiple Vitamin (MULTIVITAMIN) tablet Take 1 tablet by mouth daily.     phentermine 37.5 MG capsule Take 37.5 mg by mouth every morning.     No current facility-administered medications for this visit.    Allergies as of 12/17/2020 - Review Complete 12/17/2020  Allergen Reaction Noted   Adhesive [tape] Rash 11/20/2016   Latex Hives and Rash 11/20/2016    Family History  Adopted: Yes  Problem Relation Age of Onset   Breast cancer Maternal Grandmother  age 23   Heart disease Mother    Diverticulitis Mother    Diabetes Mother    Hypertension Mother    Hypertension Sister    Breast cancer Maternal Great-grandmother     Social History   Socioeconomic History   Marital status: Legally Separated    Spouse name: Not on file   Number of children: 1   Years of education: Not on file   Highest education level: Associate degree: occupational, Hotel manager, or vocational program  Occupational History   Not on file  Tobacco Use   Smoking status: Never   Smokeless  tobacco: Never  Vaping Use   Vaping Use: Never used  Substance and Sexual Activity   Alcohol use: Yes    Comment: occ   Drug use: No   Sexual activity: Yes    Birth control/protection: None  Other Topics Concern   Not on file  Social History Narrative   Not on file   Social Determinants of Health   Financial Resource Strain: Not on file  Food Insecurity: Not on file  Transportation Needs: No Transportation Needs   Lack of Transportation (Medical): No   Lack of Transportation (Non-Medical): No  Physical Activity: Not on file  Stress: Not on file  Social Connections: Not on file  Intimate Partner Violence: Not on file     Physical Exam: BP 138/90   Pulse 78   Ht '5\' 3"'$  (1.6 m)   Wt 179 lb 12.8 oz (81.6 kg)   SpO2 99%   BMI 31.85 kg/m  Constitutional: generally well-appearing Psychiatric: alert and oriented x3 Eyes: extraocular movements intact Mouth: oral pharynx moist, no lesions Neck: supple no lymphadenopathy Cardiovascular: heart regular rate and rhythm Lungs: clear to auscultation bilaterally Abdomen: soft, nontender, nondistended, no obvious ascites, no peritoneal signs, normal bowel sounds Extremities: no lower extremity edema bilaterally Skin: no lesions on visible extremities   Assessment and plan: 35 y.o. female with left flank pain, change in bowel habits, intermittent nausea and vomiting  First her left upper quadrant, left flank pain is not related to her GI tract.  It is very positional and point tender at left ribs, left upper back.  I explained to her that I would probably not be able to get much guidance on this clearly gastrointestinal related left flank, left upper back pain.  Second something is really changed in her bowel habits.  She had become quite a bit constipated without clear explanation.  I recommended she start taking 2 doses of MiraLAX every single day and undergo colonoscopy for further evaluation.  She does have microcytic anemia  giving me minor concern for underlying neoplasm but her young age and the fact that she has gained 20 pounds in last 6 months certainly argue against that.  30 the same time as colonoscopy think an upper endoscopy would be reasonable given her significant vomiting.  This she understands could be related to her high-dose daily ibuprofen use.  I have given her prescription for omeprazole 40 mg 1 pill once daily to help with "likely NSAID related gastritis.  And I also asked her to try to limit NSAID use as best as possible.  Please see the "Patient Instructions" section for addition details about the plan.   Owens Loffler, MD [facility] 12/17/2020, 1:52 PM  Cc: Joycelyn Man, FNP  Total time on date of encounter was 45 minutes (this included time spent preparing to see the patient reviewing records; obtaining and/or reviewing separately obtained history;  performing a medically appropriate exam and/or evaluation; counseling and educating the patient and family if present; ordering medications, tests or procedures if applicable; and documenting clinical information in the health record).

## 2020-12-17 NOTE — Patient Instructions (Addendum)
If you are age 35 or older, your body mass index should be between 23-30. Your Body mass index is 31.85 kg/m. If this is out of the aforementioned range listed, please consider follow up with your Primary Care Provider.  If you are age 29 or younger, your body mass index should be between 19-25. Your Body mass index is 31.85 kg/m. If this is out of the aformentioned range listed, please consider follow up with your Primary Care Provider.   __________________________________________________________  The [facility] providers would like to encourage you to use [facility] to communicate with providers for non-urgent requests or questions.  Due to long hold times on the telephone, sending your provider a message by [facility] may be a faster and more efficient way to get a response.  Please allow 48 business hours for a response.  Please remember that this is for non-urgent requests.   You have been scheduled for an endoscopy and colonoscopy. Please follow the written instructions given to you at your visit today. Please pick up your prep supplies at the pharmacy within the next 1-3 days. If you use inhalers (even only as needed), please bring them with you on the day of your procedure.    Please purchase the following medications over the counter and take as directed: Miralax: take 2 capfuls daily in the liquid of your choice  Limit your NSAID use.  We have sent the following medications to your pharmacy for you to pick up at your convenience: Omeprazole 40 mg: Take once daily   Thank you for trusting me with your gastrointestinal care!    Dr. Oretha Caprice

## 2021-01-24 ENCOUNTER — Encounter: Payer: Self-pay | Admitting: Gastroenterology

## 2021-01-24 ENCOUNTER — Ambulatory Visit (AMBULATORY_SURGERY_CENTER): Payer: 59 | Admitting: Gastroenterology

## 2021-01-24 ENCOUNTER — Other Ambulatory Visit: Payer: Self-pay

## 2021-01-24 VITALS — BP 155/93 | HR 70 | Temp 98.4°F | Resp 11 | Ht 63.0 in | Wt 179.0 lb

## 2021-01-24 DIAGNOSIS — B9681 Helicobacter pylori [H. pylori] as the cause of diseases classified elsewhere: Secondary | ICD-10-CM

## 2021-01-24 DIAGNOSIS — K5900 Constipation, unspecified: Secondary | ICD-10-CM

## 2021-01-24 DIAGNOSIS — R194 Change in bowel habit: Secondary | ICD-10-CM

## 2021-01-24 DIAGNOSIS — K2970 Gastritis, unspecified, without bleeding: Secondary | ICD-10-CM | POA: Diagnosis not present

## 2021-01-24 DIAGNOSIS — K2960 Other gastritis without bleeding: Secondary | ICD-10-CM

## 2021-01-24 DIAGNOSIS — R112 Nausea with vomiting, unspecified: Secondary | ICD-10-CM

## 2021-01-24 DIAGNOSIS — R1012 Left upper quadrant pain: Secondary | ICD-10-CM

## 2021-01-24 MED ORDER — SODIUM CHLORIDE 0.9 % IV SOLN: 500.0000 mL | Freq: Once | INTRAVENOUS | Status: DC

## 2021-01-24 NOTE — Progress Notes (Signed)
DT - VS  Pt had uterian polyp removed 01/20/21 Monday.  Pt reports still spotting blood.  Chuck placed under pt. Maw  Pt is adopted - but now knows her birth mother.  She does not know a lot of her family history though.

## 2021-01-24 NOTE — Op Note (Signed)
[facility] Patient Name: Margaret Roach Procedure Date: 01/24/2021 1:29 PM MRN: [id number] Endoscopist: Milus Banister , MD Age: 35 Referring MD:  Date of Birth: 12/16/1985 Gender: Female Account #: [id number] Procedure:                Upper GI endoscopy Indications:              Nausea with vomiting Medicines:                Monitored Anesthesia Care Procedure:                Pre-Anesthesia Assessment:                           - Prior to the procedure, a History and Physical                            was performed, and patient medications and                            allergies were reviewed. The patient's tolerance of                            previous anesthesia was also reviewed. The risks                            and benefits of the procedure and the sedation                            options and risks were discussed with the patient.                            All questions were answered, and informed consent                            was obtained. Prior Anticoagulants: The patient has                            taken no previous anticoagulant or antiplatelet                            agents. ASA Grade Assessment: II - A patient with                            mild systemic disease. After reviewing the risks                            and benefits, the patient was deemed in                            satisfactory condition to undergo the procedure.                           After obtaining informed consent, the endoscope was  passed under direct vision. Throughout the                            procedure, the patient's blood pressure, pulse, and                            oxygen saturations were monitored continuously. The                            GIF [id number] #[id number] was introduced through the                            mouth, and advanced to the second part of duodenum.                            The upper GI endoscopy was  accomplished without                            difficulty. The patient tolerated the procedure                            well. Scope In: Scope Out: Findings:                 Minimal inflammation characterized by granularity                            was found in the gastric antrum. Biopsies were                            taken with a cold forceps for histology.                           The exam was otherwise without abnormality. Complications:            No immediate complications. Estimated blood loss:                            None. Estimated Blood Loss:     Estimated blood loss: none. Impression:               - Mild, non-specific gastritis. Biopsied to check                            for H. pylori.                           - The examination was otherwise normal. Recommendation:           - Patient has a contact number available for                            emergencies. The signs and symptoms of potential                            delayed complications were discussed with the  patient. Return to normal activities tomorrow.                            Written discharge instructions were provided to the                            patient.                           - Resume previous diet.                           - Continue avoiding NSAIDs and stay on once daily                            PPI for now.                           - Await pathology results. If biopsies are + for H.                            pylori will treat with appropriate antibiotics. Milus Banister, MD 01/24/2021 1:52:08 PM This report has been signed electronically.

## 2021-01-24 NOTE — Progress Notes (Signed)
HPI: This is a cahnge in bowels, also nausea and vomiting   ROS: complete GI ROS as described in HPI, all other review negative.  Constitutional:  No unintentional weight loss   Past Medical History:  Diagnosis Date   Allergy    Anemia    Anxiety    Blood transfusion without reported diagnosis    2008 [place] - heavy menstal periods   Endometriosis    Ovarian cyst    Uterine polyp     Past Surgical History:  Procedure Laterality Date   CHROMOPERTUBATION Bilateral 03/01/2018   Procedure: CHROMOPERTUBATION;  Surgeon: Sanjuana Kava, MD;  Location: [facility];  Service: Gynecology;  Laterality: Bilateral;   LAPAROSCOPIC OVARIAN CYSTECTOMY     LAPAROSCOPIC OVARIAN CYSTECTOMY Left 03/01/2018   Procedure: LAPAROSCOPIC OVARIAN CYSTECTOMY, LYSIS OF ADHESIONS;  Surgeon: Sanjuana Kava, MD;  Location: [facility];  Service: Gynecology;  Laterality: Left;   uterian polypectomy     01/20/2021    Current Outpatient Medications  Medication Sig Dispense Refill   ibuprofen (ADVIL,MOTRIN) 600 MG tablet 1  po  pc every 6 hours for 5 days then prn-post operative pain; first dose 9 p.m. 03/01/18 30 tablet 0   Multiple Vitamin (MULTIVITAMIN) tablet Take 1 tablet by mouth daily.     omeprazole (PRILOSEC) 40 MG capsule Take 1 capsule (40 mg total) by mouth daily. 30 capsule 3   phentermine 37.5 MG capsule Take 37.5 mg by mouth every morning.     polyethylene glycol (MIRALAX) 17 g packet Take 34 g by mouth daily. 14 each 0   SUPREP BOWEL PREP KIT 17.5-3.13-1.6 GM/177ML SOLN Suprep-Use as directed 354 mL 0   Current Facility-Administered Medications  Medication Dose Route Frequency Provider Last Rate Last Admin   0.9 %  sodium chloride infusion  500 mL Intravenous Once Milus Banister, MD        Allergies as of 01/24/2021 - Review Complete 01/24/2021  Allergen Reaction Noted   Adhesive [tape] Rash 11/20/2016   Latex Hives and Rash 11/20/2016    Family History   Adopted: Yes  Problem Relation Age of Onset   Heart disease Mother    Diverticulitis Mother    Diabetes Mother    Hypertension Mother    Hypertension Sister    Breast cancer Maternal Grandmother        age 53   Breast cancer Maternal Great-grandmother     Social History   Socioeconomic History   Marital status: Legally Separated    Spouse name: Not on file   Number of children: 1   Years of education: Not on file   Highest education level: Associate degree: occupational, Hotel manager, or vocational program  Occupational History   Not on file  Tobacco Use   Smoking status: Never   Smokeless tobacco: Never  Vaping Use   Vaping Use: Never used  Substance and Sexual Activity   Alcohol use: Yes    Comment: occ   Drug use: No   Sexual activity: Yes    Birth control/protection: None    Comment: last period December 26, 2020  Other Topics Concern   Not on file  Social History Narrative   Not on file   Social Determinants of Health   Financial Resource Strain: Not on file  Food Insecurity: Not on file  Transportation Needs: No Transportation Needs   Lack of Transportation (Medical): No   Lack of Transportation (Non-Medical): No  Physical Activity: Not on file  Stress: Not on file  Social Connections: Not on file  Intimate Partner Violence: Not on file     Physical Exam: BP 133/88   Pulse 61   Temp 98.4 F (36.9 C)   Ht $R'5\' 3"'zV$  (1.6 m)   Wt 179 lb (81.2 kg)   SpO2 100%   BMI 31.71 kg/m  Constitutional: generally well-appearing Psychiatric: alert and oriented x3 Lungs: CTA bilaterally Heart: no MCR  Assessment and plan: 35 y.o. female with change in bowels, also nausea and vomiting  Colonoscopy and EGD today  Care is appropriate for the ambulatory setting.  Owens Loffler, MD [facility] 01/24/2021, 1:09 PM

## 2021-01-24 NOTE — Progress Notes (Signed)
Called to room to assist during endoscopic procedure.  Patient ID and intended procedure confirmed with present staff. Received instructions for my participation in the procedure from the performing physician.  

## 2021-01-24 NOTE — Patient Instructions (Addendum)
Information on gastritis given to you today.  Await pathology results.  Resume previous diet and medications.  Stay on your once daily Prilosec.   Avoid NSAIDS (Aspirin, Ibuprofen, Aleve, Naproxen), you may use Tylenol as needed.   YOU HAD AN ENDOSCOPIC PROCEDURE TODAY AT [facility]:   Refer to the procedure report that was given to you for any specific questions about what was found during the examination.  If the procedure report does not answer your questions, please call your gastroenterologist to clarify.  If you requested that your care partner not be given the details of your procedure findings, then the procedure report has been included in a sealed envelope for you to review at your convenience later.  YOU SHOULD EXPECT: Some feelings of bloating in the abdomen. Passage of more gas than usual.  Walking can help get rid of the air that was put into your GI tract during the procedure and reduce the bloating. If you had a lower endoscopy (such as a colonoscopy or flexible sigmoidoscopy) you may notice spotting of blood in your stool or on the toilet paper. If you underwent a bowel prep for your procedure, you may not have a normal bowel movement for a few days.  Please Note:  You might notice some irritation and congestion in your nose or some drainage.  This is from the oxygen used during your procedure.  There is no need for concern and it should clear up in a day or so.  SYMPTOMS TO REPORT IMMEDIATELY:  Following lower endoscopy (colonoscopy or flexible sigmoidoscopy):  Excessive amounts of blood in the stool  Significant tenderness or worsening of abdominal pains  Swelling of the abdomen that is new, acute  Fever of 100F or higher  Following upper endoscopy (EGD)  Vomiting of blood or coffee ground material  New chest pain or pain under the shoulder blades  Painful or persistently difficult swallowing  New shortness of breath  Fever of 100F or  higher  Black, tarry-looking stools  For urgent or emergent issues, a gastroenterologist can be reached at any hour by calling [phone]. Do not use MyChart messaging for urgent concerns.    DIET:  We do recommend a small meal at first, but then you may proceed to your regular diet.  Drink plenty of fluids but you should avoid alcoholic beverages for 24 hours.  ACTIVITY:  You should plan to take it easy for the rest of today and you should NOT DRIVE or use heavy machinery until tomorrow (because of the sedation medicines used during the test).    FOLLOW UP: Our staff will call the number listed on your records 48-72 hours following your procedure to check on you and address any questions or concerns that you may have regarding the information given to you following your procedure. If we do not reach you, we will leave a message.  We will attempt to reach you two times.  During this call, we will ask if you have developed any symptoms of COVID 19. If you develop any symptoms (ie: fever, flu-like symptoms, shortness of breath, cough etc.) before then, please call [phone].  If you test positive for Covid 19 in the 2 weeks post procedure, please call and report this information to [place].    If any biopsies were taken you will be contacted by phone or by letter within the next 1-3 weeks.  Please call us at [phone] if you have not heard about the  biopsies in 3 weeks.    SIGNATURES/CONFIDENTIALITY: You and/or your care partner have signed paperwork which will be entered into your electronic medical record.  These signatures attest to the fact that that the information above on your After Visit Summary has been reviewed and is understood.  Full responsibility of the confidentiality of this discharge information lies with you and/or your care-partner.

## 2021-01-24 NOTE — Progress Notes (Signed)
To PACU, VSS. Report to [email or web address] 

## 2021-01-24 NOTE — Op Note (Signed)
[facility] Patient Name: Margaret Roach Procedure Date: 01/24/2021 1:30 PM MRN: [id number] Endoscopist: Milus Banister , MD Age: 35 Referring MD:  Date of Birth: [birth year] Gender: Female Account #: [id number] Procedure:                Colonoscopy Indications:              Change in bowel habits Medicines:                Monitored Anesthesia Care Procedure:                Pre-Anesthesia Assessment:                           - Prior to the procedure, a History and Physical                            was performed, and patient medications and                            allergies were reviewed. The patient's tolerance of                            previous anesthesia was also reviewed. The risks                            and benefits of the procedure and the sedation                            options and risks were discussed with the patient.                            All questions were answered, and informed consent                            was obtained. Prior Anticoagulants: The patient has                            taken no previous anticoagulant or antiplatelet                            agents. ASA Grade Assessment: I - A normal, healthy                            patient. After reviewing the risks and benefits,                            the patient was deemed in satisfactory condition to                            undergo the procedure.                           After obtaining informed consent, the colonoscope  was passed under direct vision. Throughout the                            procedure, the patient's blood pressure, pulse, and                            oxygen saturations were monitored continuously. The                            CF [id number] #[id number] was introduced through the anus                            and advanced to the the cecum, identified by                            appendiceal orifice and ileocecal valve. The                             colonoscopy was performed without difficulty. The                            patient tolerated the procedure well. The quality                            of the bowel preparation was good. The ileocecal                            valve, appendiceal orifice, and rectum were                            photographed. Scope In: 1:32:07 PM Scope Out: 1:41:37 PM Scope Withdrawal Time: 0 hours 7 minutes 8 seconds  Total Procedure Duration: 0 hours 9 minutes 30 seconds  Findings:                 The entire examined colon appeared normal on direct                            and retroflexion views. Complications:            No immediate complications. Estimated blood loss:                            None. Estimated Blood Loss:     Estimated blood loss: none. Impression:               - The entire examined colon is normal on direct and                            retroflexion views.                           - No polyps or cancers. Recommendation:           - Repeat colonoscopy at age 34 for screening.                           -  EGD now. Milus Banister, MD 01/24/2021 1:43:47 PM This report has been signed electronically.

## 2021-01-28 ENCOUNTER — Telehealth: Payer: Self-pay

## 2021-01-28 NOTE — Telephone Encounter (Signed)
  Follow up Call-  Call back number 01/24/2021  Post procedure Call Back phone  # [phone] cell  Permission to leave phone message Yes  Some recent data might be hidden     Patient questions:  Do you have a fever, pain , or abdominal swelling? No. Pain Score  0 *  Have you tolerated food without any problems? Yes.    Have you been able to return to your normal activities? Yes.    Do you have any questions about your discharge instructions: Diet   No. Medications  No. Follow up visit  No.  Do you have questions or concerns about your Care? No.  Actions: * If pain score is 4 or above: No action needed, pain <4.  Have you developed a fever since your procedure? no  2.   Have you had an respiratory symptoms (SOB or cough) since your procedure? no  3.   Have you tested positive for COVID 19 since your procedure no  4.   Have you had any family members/close contacts diagnosed with the COVID 19 since your procedure?  no   If yes to any of these questions please route to Joylene John, RN and Joella Prince, RN

## 2021-02-11 ENCOUNTER — Telehealth: Payer: Self-pay | Admitting: Gastroenterology

## 2021-02-11 MED ORDER — PYLERA 140-125-125 MG PO CAPS: 3.0000 | ORAL_CAPSULE | Freq: Three times a day (TID) | ORAL | 0 refills | Status: AC

## 2021-02-11 NOTE — Telephone Encounter (Signed)
Pt called stating she has not received a callback for her results, but said she saw that she had H-Pylori from My Chart.  She would like a call to discuss her course of treatment regarding results.  Please call.

## 2021-02-11 NOTE — Telephone Encounter (Signed)
  Call was placed to the pt. She has been advised of the results and will pick up prescription.  She states she will call back next week to make a follow up appt.    Margaret Roach Please call the patient.  Her stomach was + for H. Pylori infection.  She needs pylera 10 day course.  Also OV with me in 2 months.     Megan, no result note is necessary.  Thanks

## 2021-04-25 ENCOUNTER — Telehealth: Payer: Self-pay

## 2021-04-25 ENCOUNTER — Encounter: Payer: Self-pay | Admitting: Gastroenterology

## 2021-05-14 ENCOUNTER — Ambulatory Visit: Payer: 59 | Admitting: Gastroenterology

## 2022-06-20 IMAGING — US US BREAST*R* LIMITED INC AXILLA
1 series · 6 of 6 positions shown · non-contrast
Comparison: Previous exam(s).

CLINICAL DATA: 34-year-old female presenting for evaluation of a
tender palpable lump in the right breast identified on clinical
breast exam. The patient has family history of breast cancer in her
maternal grandmother and maternal great grandmother.

EXAM:
DIGITAL DIAGNOSTIC BILATERAL MAMMOGRAM WITH TOMOSYNTHESIS AND CAD;
ULTRASOUND RIGHT BREAST LIMITED
TECHNIQUE: Bilateral digital diagnostic mammography and breast tomosynthesis
was performed. The images were evaluated with computer-aided
detection.; Targeted ultrasound examination of the right breast was
performed

[Series 1: us breast*right* limited inc axilla · 0.07mm/px · 6 of 6 slices shown]
[im 1/6]
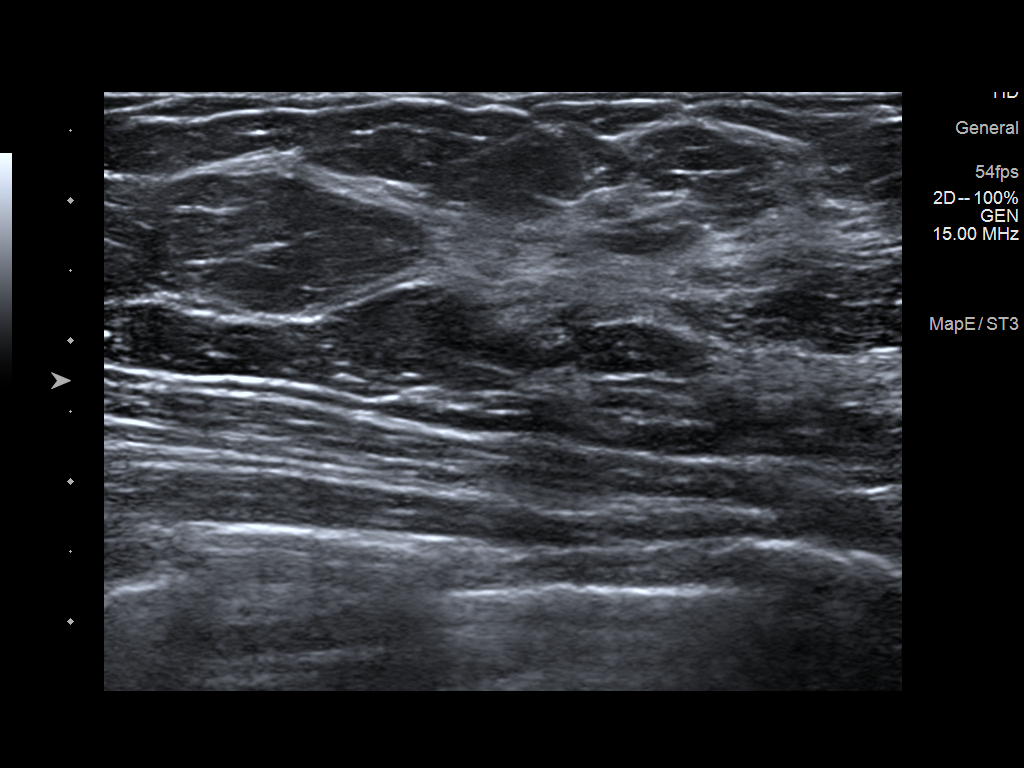
[im 2/6]
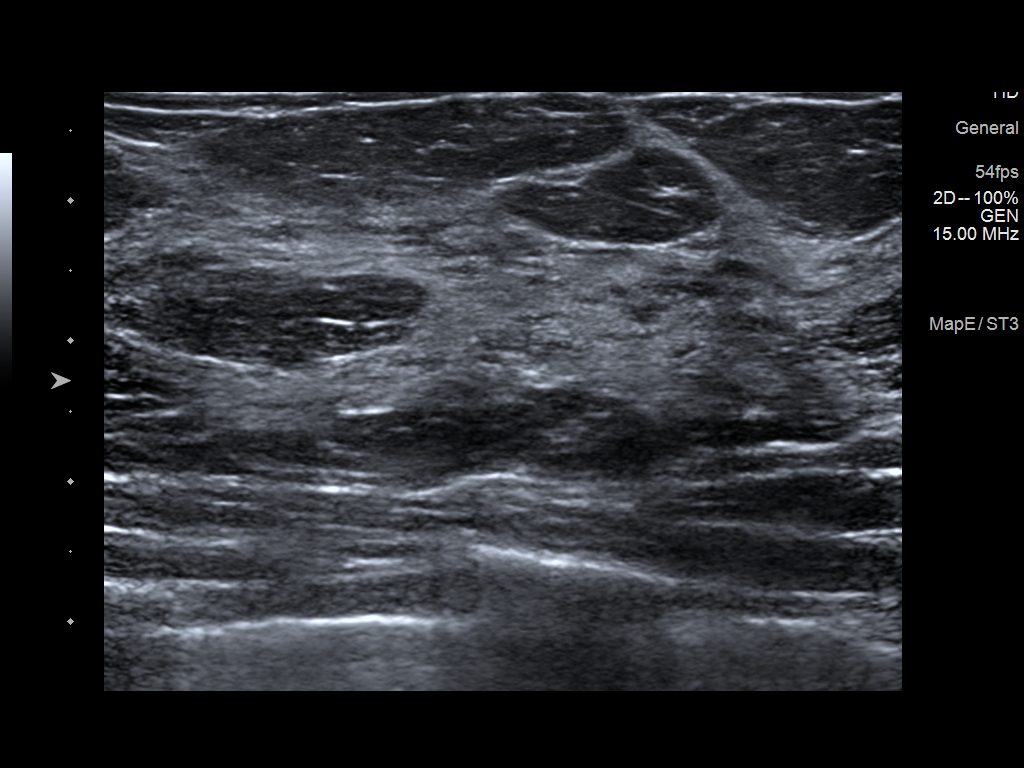
[im 3/6]
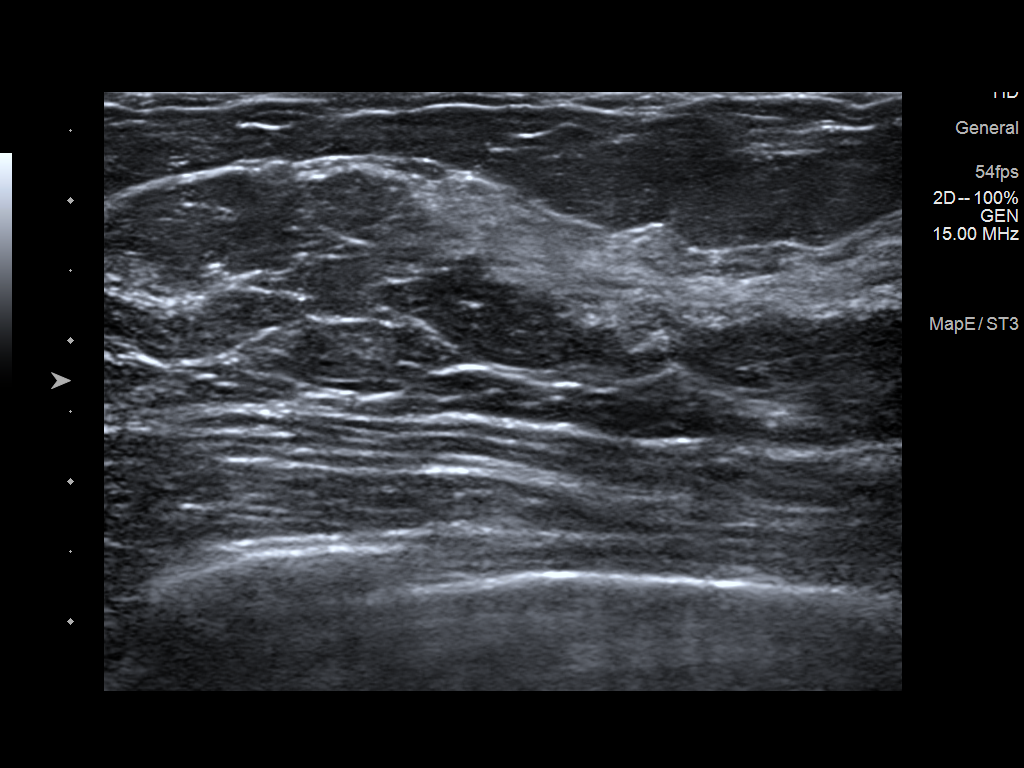
[im 4/6]
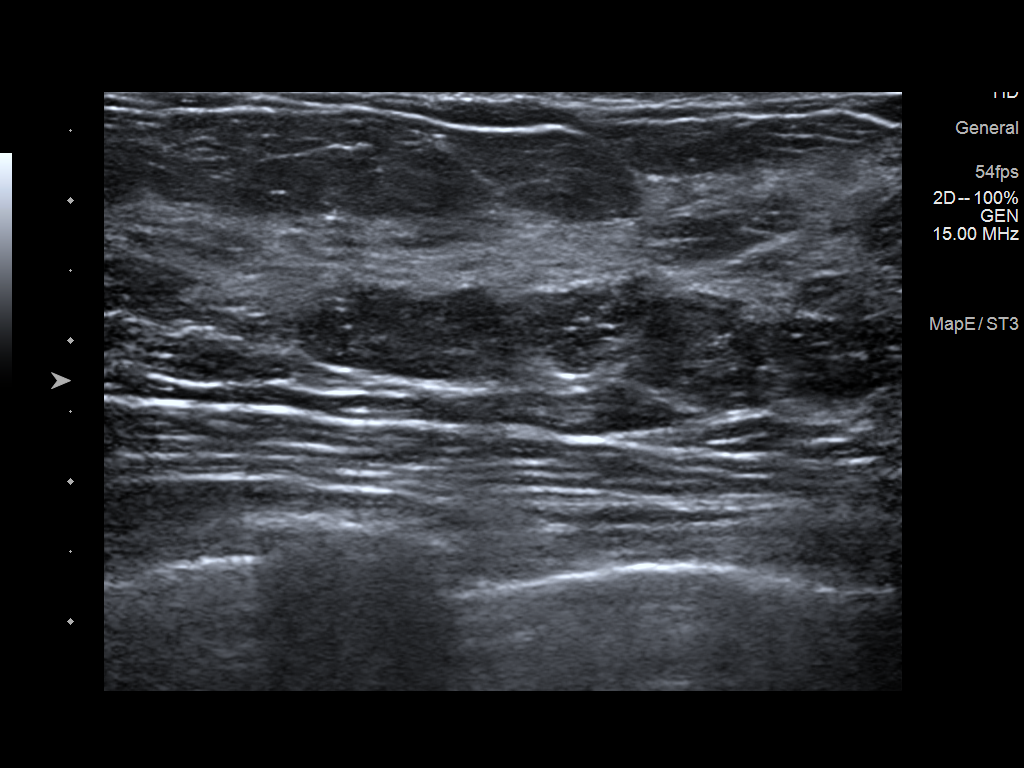
[im 5/6]
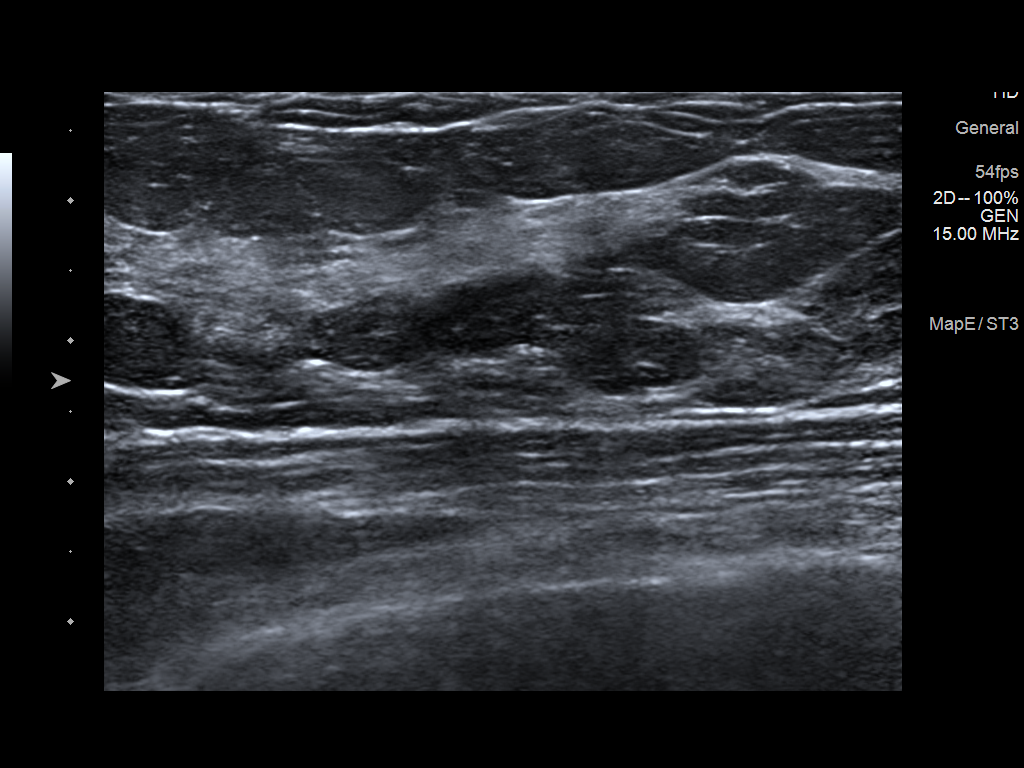
[im 6/6]
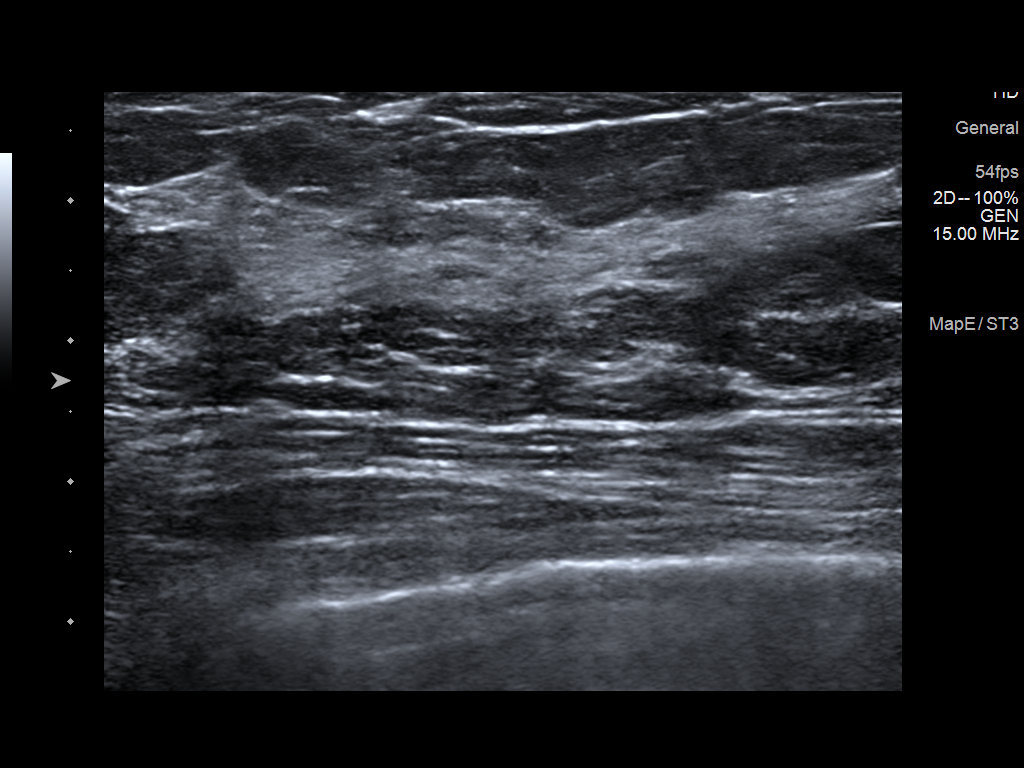

[6 of 6 positions shown; findings below may reference images not displayed]

ACR Breast Density Category c: The breast tissue is heterogeneously
dense, which may obscure small masses.
FINDINGS: A BB indicating the palpable site of concern has been placed on the
upper inner quadrant of the right breast. No suspicious mammographic
findings are identified deep to the marker. No suspicious
calcifications, masses or areas of distortion are seen in the
bilateral breasts.

Ultrasound targeted to the palpable site in the right breast at 12-1
o'clock demonstrates normal fibroglandular tissue. No suspicious
masses or areas of shadowing are identified.
IMPRESSION: 1. There are no suspicious mammographic or targeted sonographic
abnormalities at the tender palpable site of concern.

2.  No mammographic evidence of malignancy in the bilateral breasts.

RECOMMENDATION:
1. Breast pain is a common condition, which will often resolve on
its own without intervention. It can be affected by hormonal
changes, medication side effect, weight changes and fit of the bra.
Pain may also be referred from other adjacent areas of the body.
Breast pain may be improved by wearing adequate well-fitting
support, over-the-counter topical and oral NSAID medication, low-fat
diet, and ice/heat as needed. Studies have shown an improvement in
cyclic pain with use of evening primrose oil and vitamin E. Clinical
follow-up recommended to discuss any further work-up recommendations
and appropriate treatment.

2. Clinical follow-up recommended for the palpable area of concern
in the right breast. Any further workup should be based on clinical
grounds.

3. Consider genetics assessment to determine the patient's lifetime
risk of breast cancer given her family history, if this has not
already been performed. Per American Cancer Society guidelines, if
the patient has a calculated lifetime risk of developing breast
cancer of greater than 20%, annual screening MRI of the breasts
would be recommended at the time of screening mammography.

4. If the patient is not determined to be high risk for breast
cancer, she may start annual screening mammography at age 40.

I have discussed the findings and recommendations with the patient.
If applicable, a reminder letter will be sent to the patient
regarding the next appointment.

BI-RADS CATEGORY  1: Negative.

## 2022-07-20 ENCOUNTER — Other Ambulatory Visit (HOSPITAL_BASED_OUTPATIENT_CLINIC_OR_DEPARTMENT_OTHER): Payer: Self-pay

## 2022-07-20 ENCOUNTER — Other Ambulatory Visit (HOSPITAL_COMMUNITY): Payer: Self-pay

## 2022-07-20 MED ORDER — WEGOVY 0.25 MG/0.5ML ~~LOC~~ SOAJ
0.2500 mg | SUBCUTANEOUS | 0 refills | Status: AC
  Filled 2022-07-20: qty 2, 28d supply, fill #0

## 2022-07-21 ENCOUNTER — Other Ambulatory Visit (HOSPITAL_BASED_OUTPATIENT_CLINIC_OR_DEPARTMENT_OTHER): Payer: Self-pay

## 2022-07-21 ENCOUNTER — Encounter (HOSPITAL_BASED_OUTPATIENT_CLINIC_OR_DEPARTMENT_OTHER): Payer: Self-pay

## 2022-08-13 ENCOUNTER — Other Ambulatory Visit (HOSPITAL_BASED_OUTPATIENT_CLINIC_OR_DEPARTMENT_OTHER): Payer: Self-pay

## 2022-08-13 MED ORDER — WEGOVY 0.5 MG/0.5ML ~~LOC~~ SOAJ
0.5000 mg | SUBCUTANEOUS | 0 refills | Status: AC
  Filled 2022-08-13: qty 2, 28d supply, fill #0

## 2022-09-07 IMAGING — CR DG ABDOMEN 2V
2 series · 2 of 2 positions shown · non-contrast
Comparison: Radiograph 11/26/2017.

CLINICAL DATA: Left-sided abdominal pain with nausea and vomiting
for 3 weeks. History of endometriosis.

EXAM:
ABDOMEN - 2 VIEW

[w abdomen upright]
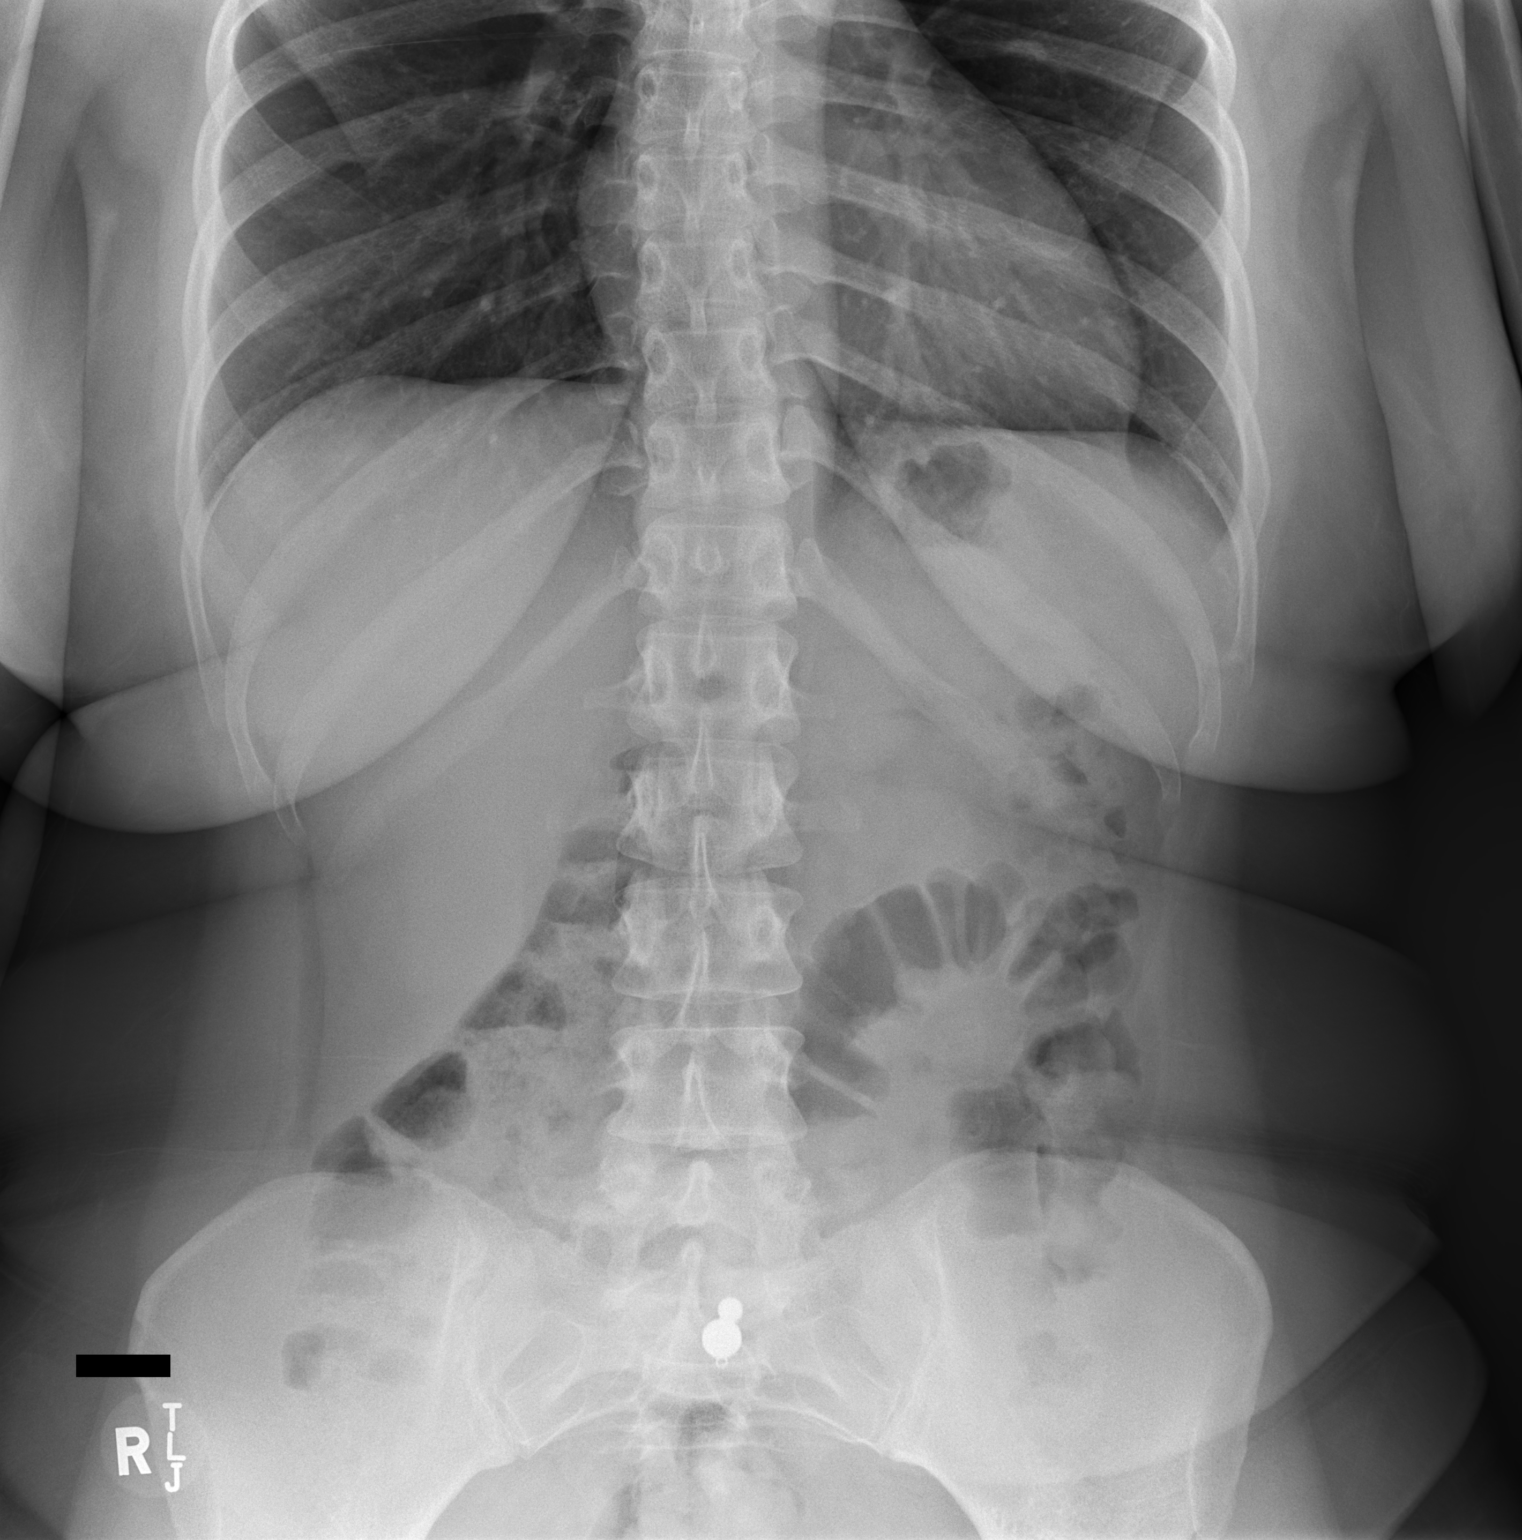

[t abdomen supine]
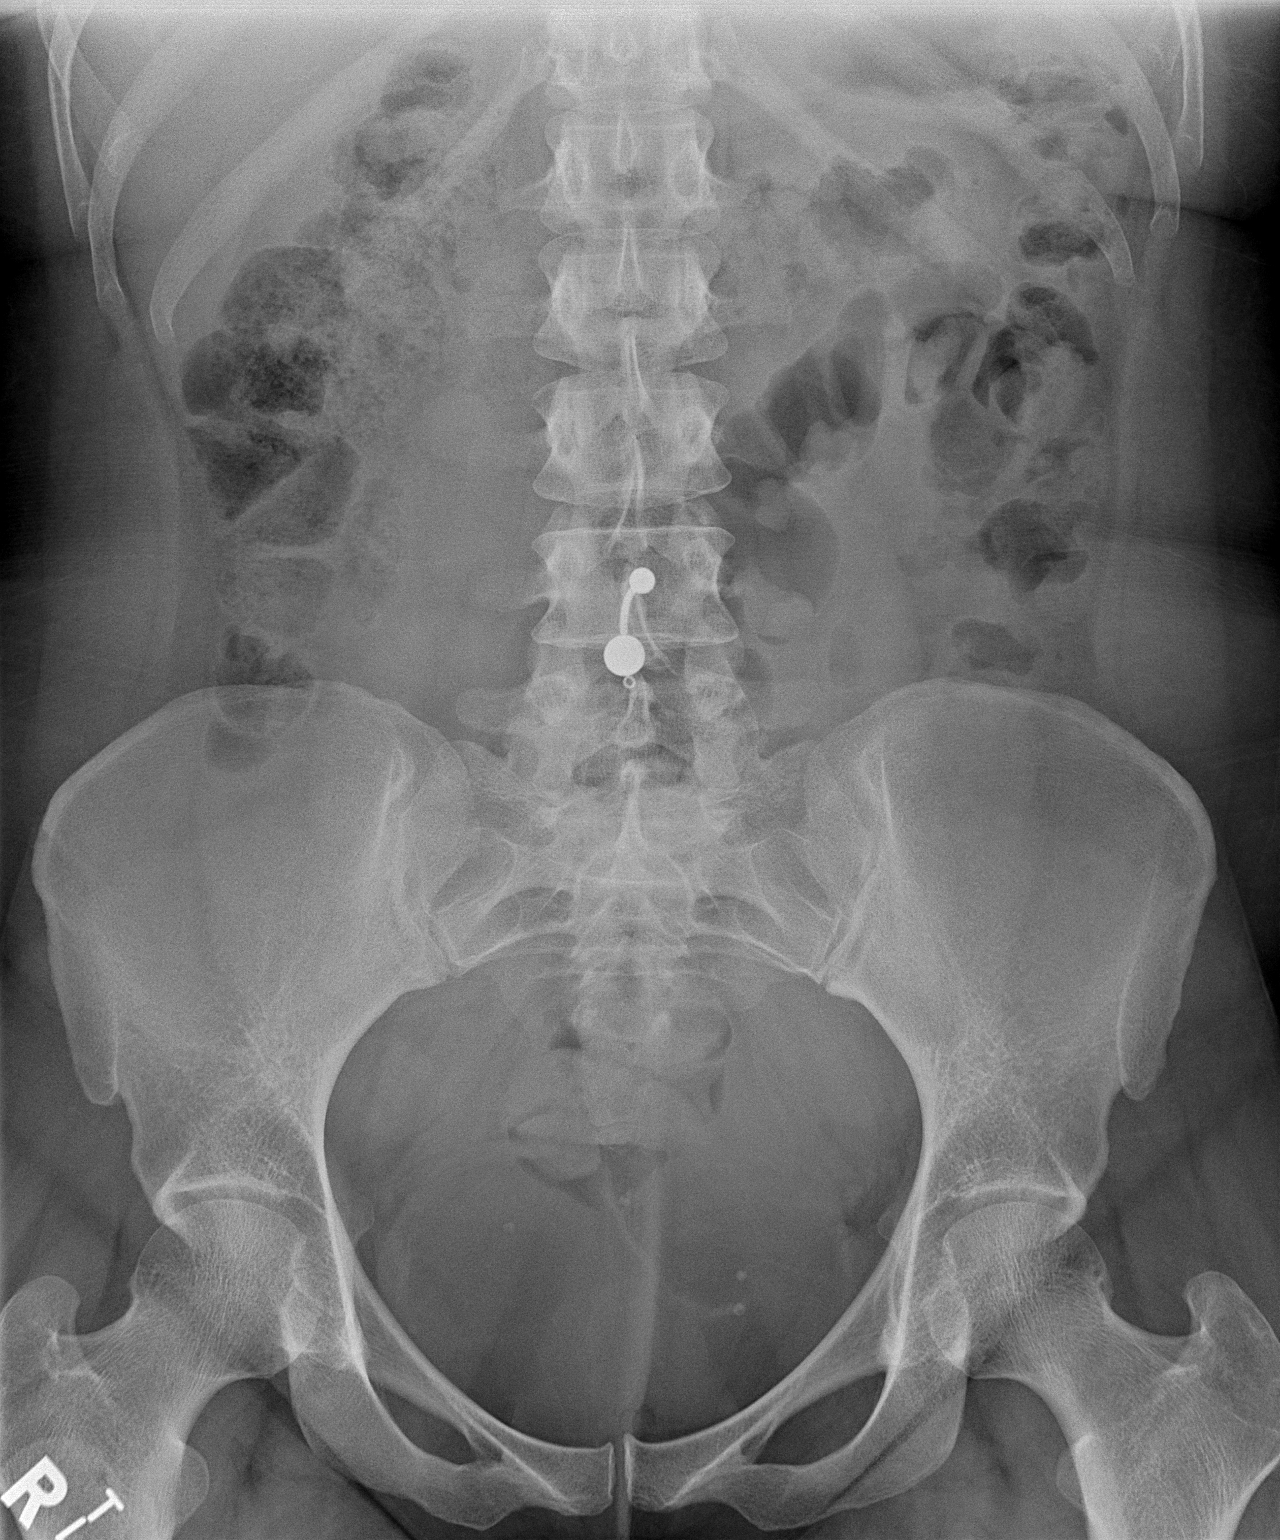

[2 of 2 positions shown; findings below may reference images not displayed]

FINDINGS: The bowel gas pattern is normal. There is no free intraperitoneal
air, bowel wall thickening or suspicious abdominal calcification.
Small pelvic calcifications are similar to previous study, likely
phleboliths. A moderate amount of stool is present in the proximal
to mid colon. The bones appear unremarkable.
IMPRESSION: No evidence of acute cardiopulmonary process. Possible mild
constipation.

## 2022-09-11 ENCOUNTER — Other Ambulatory Visit (HOSPITAL_BASED_OUTPATIENT_CLINIC_OR_DEPARTMENT_OTHER): Payer: Self-pay

## 2022-09-11 IMAGING — CT CT ABD-PELV W/ CM
2 of 4 series · 16 of 46 positions shown, 18 images · IV contrast (APPLIED)
Comparison: None.

CLINICAL DATA: Left-sided abdominal pain for 2 weeks

EXAM:
CT ABDOMEN AND PELVIS WITH CONTRAST
TECHNIQUE: Multidetector CT imaging of the abdomen and pelvis was performed
using the standard protocol following bolus administration of
intravenous contrast.
CONTRAST:  100mL OMNIPAQUE IOHEXOL 300 MG/ML  SOLN

[Series 2: abd pel w · axial · 0.69mm/px · z∈[-959,-569]mm · 13 of 86 slices shown, 15 images]
[im 4/86  soft-tissue]
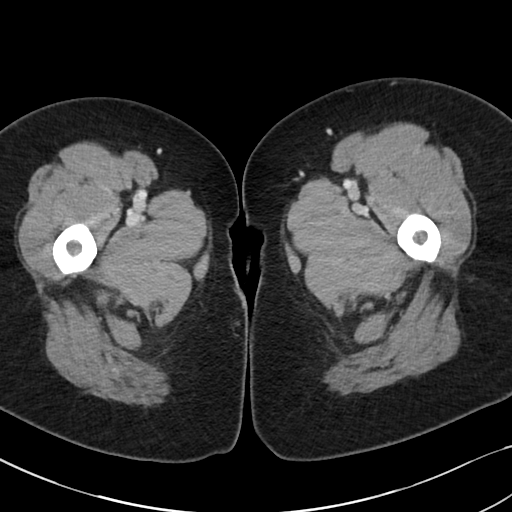
[im 4/86  bone]
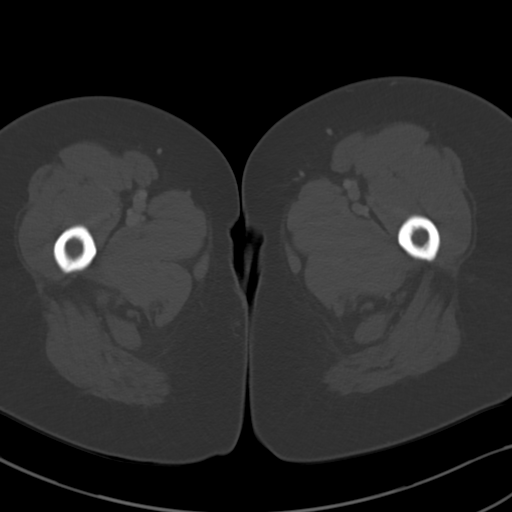
[im 10/86  soft-tissue]
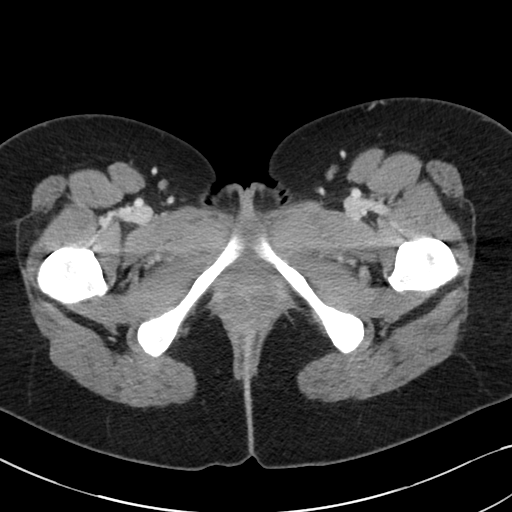
[im 17/86  soft-tissue]
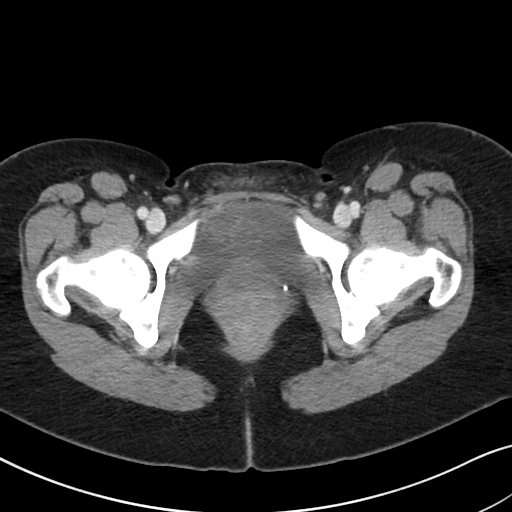
[im 23/86  soft-tissue]
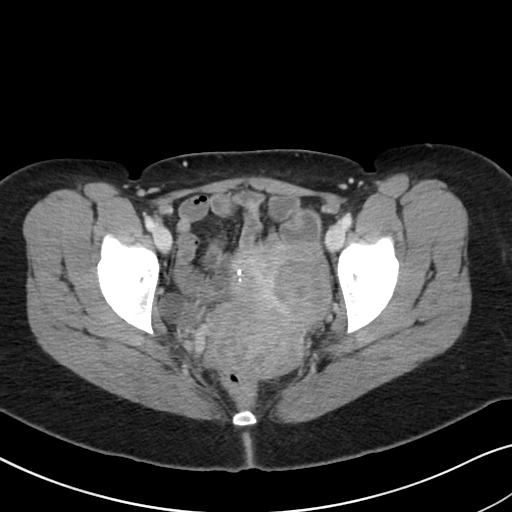
[im 30/86  soft-tissue]
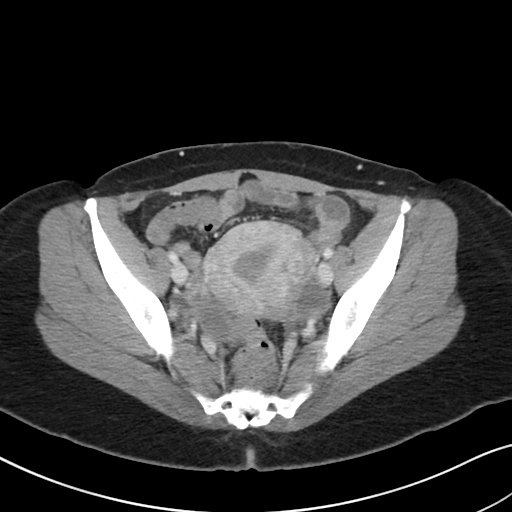
[im 36/86  soft-tissue]
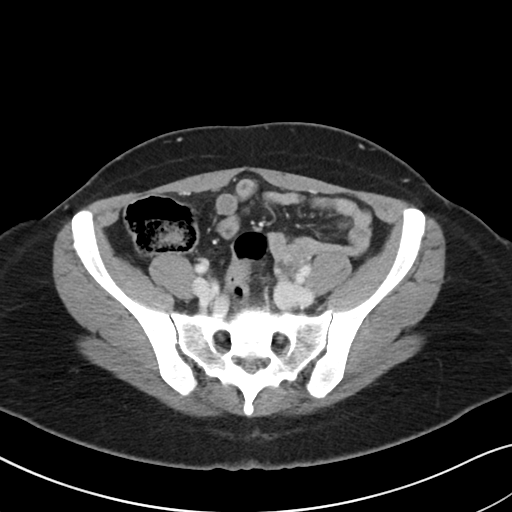
[im 43/86  soft-tissue]
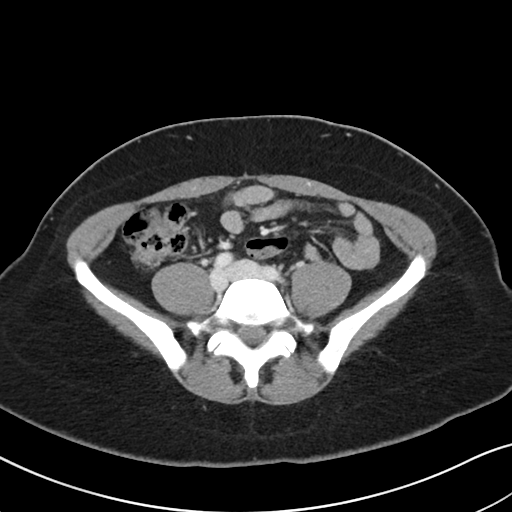
[im 50/86  soft-tissue]
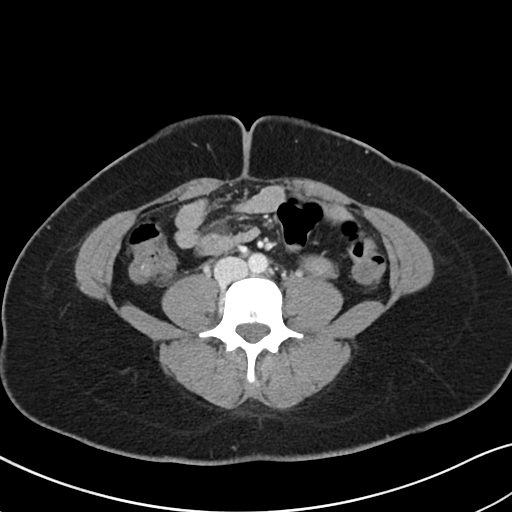
[im 56/86  soft-tissue]
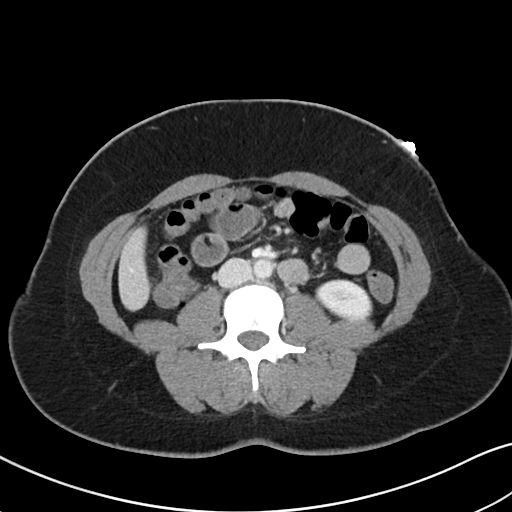
[im 56/86  bone]
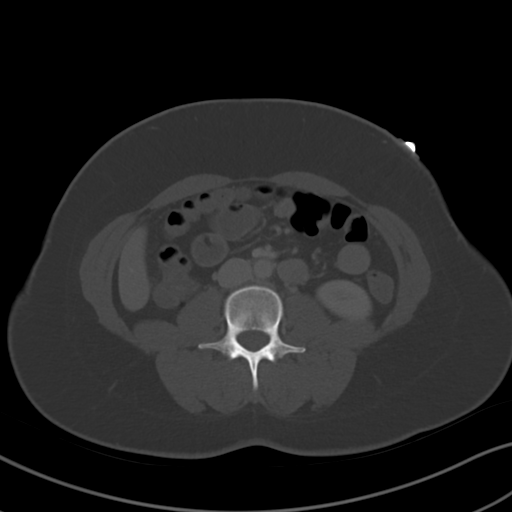
[im 63/86  soft-tissue]
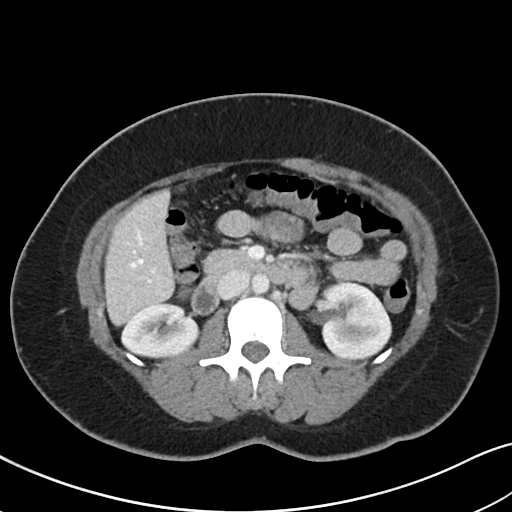
[im 69/86  soft-tissue]
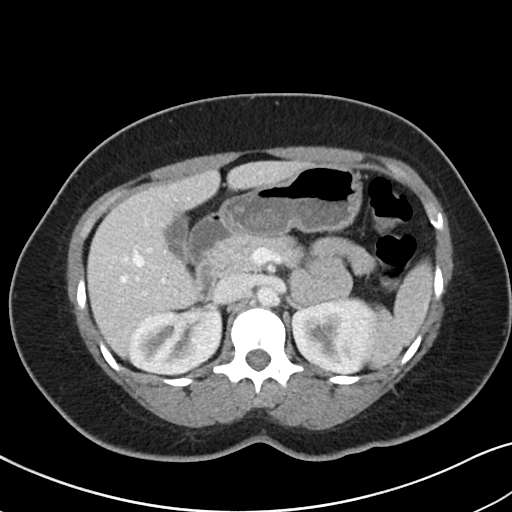
[im 76/86  soft-tissue]
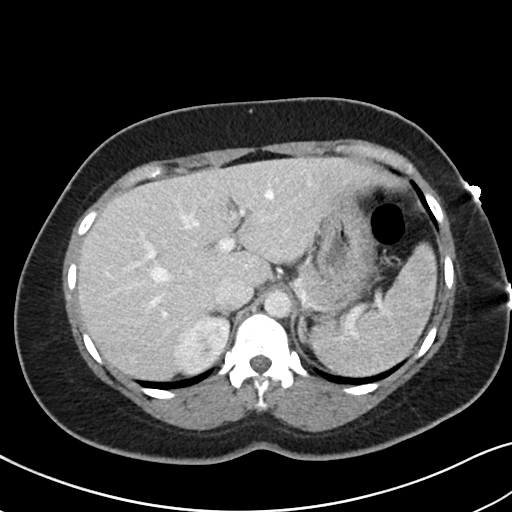
[im 82/86  soft-tissue]
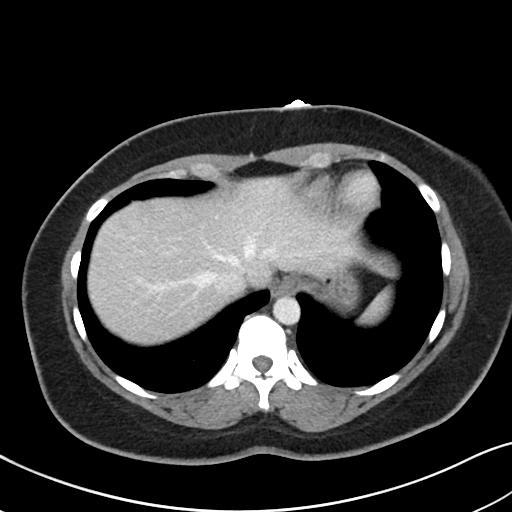

[Series 5: coronal · coronal · 0.72mm/px · 3 of 85 slices shown]
[im 29/85  soft-tissue]
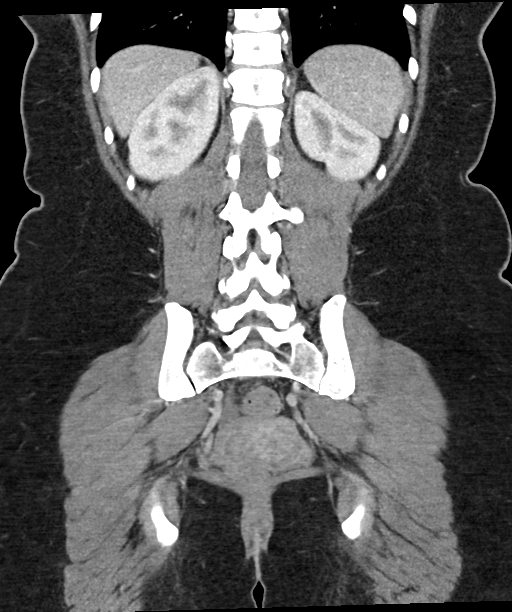
[im 38/85  soft-tissue]
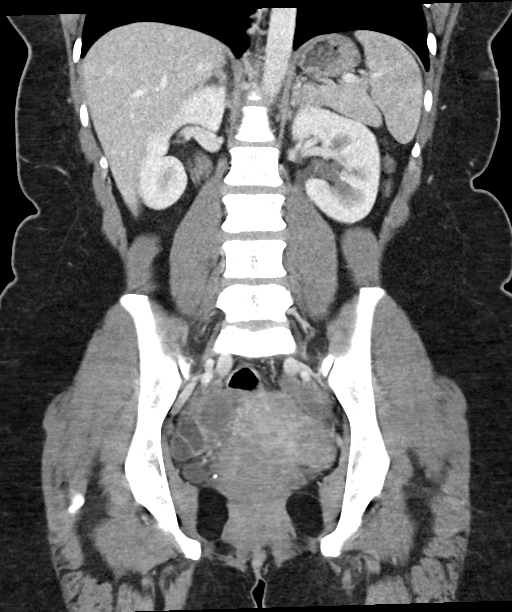
[im 47/85  soft-tissue]
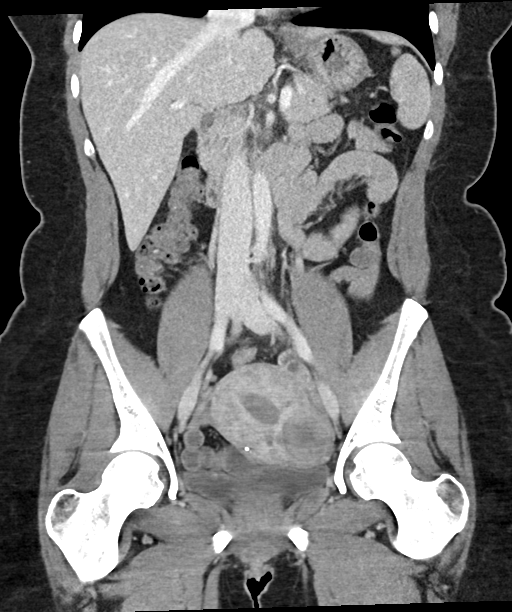

[16 of 46 positions shown; findings below may reference images not displayed]

FINDINGS: Lower chest: No acute abnormality.

Hepatobiliary: No focal liver abnormality is seen. No gallstones,
gallbladder wall thickening, or biliary dilatation.

Pancreas: Unremarkable. No pancreatic ductal dilatation or
surrounding inflammatory changes.

Spleen: Normal in size without focal abnormality.

Adrenals/Urinary Tract: Adrenal glands are within normal limits.
Kidneys are well visualized within normal enhancement pattern
bilaterally. No renal calculi or obstructive changes are seen.
Ureters are within normal limits. Bladder is decompressed.

Stomach/Bowel: Colon shows no obstructive or inflammatory changes.
The appendix is within normal limits. Small bowel and stomach are
unremarkable.

Vascular/Lymphatic: No significant vascular findings are present. No
enlarged abdominal or pelvic lymph nodes.

Reproductive: Uterus demonstrates prominent endometrium likely
related to current menstrual status. Some irregular masslike
densities are noted with decreased enhancement consistent with
uterine fibroids. Largest of these measures approximately 4.6 cm in
greatest dimension. Simple appearing ovarian cysts are noted
bilaterally. Right ovarian cyst measures 3.2 cm. Left cyst measures
2.2 cm.

Other: No abdominal wall hernia or abnormality. No abdominopelvic
ascites.

Musculoskeletal: No acute or significant osseous findings.
IMPRESSION: Changes consistent with fibroid uterus.

Bilateral simple appearing ovarian cysts are noted. No follow-up
imaging recommended. Note: This recommendation does not apply to
premenarchal patients and to those with increased risk (genetic,
family history, elevated tumor markers or other high-risk factors)
of ovarian cancer. Reference: JACR [DATE]):[id number]

No other focal abnormality is noted.

## 2022-09-14 ENCOUNTER — Other Ambulatory Visit (HOSPITAL_BASED_OUTPATIENT_CLINIC_OR_DEPARTMENT_OTHER): Payer: Self-pay

## 2022-09-14 MED ORDER — WEGOVY 1 MG/0.5ML ~~LOC~~ SOAJ
1.0000 mg | SUBCUTANEOUS | 0 refills | Status: DC
  Filled 2022-09-14: qty 2, 28d supply, fill #0

## 2022-10-06 ENCOUNTER — Other Ambulatory Visit (HOSPITAL_BASED_OUTPATIENT_CLINIC_OR_DEPARTMENT_OTHER): Payer: Self-pay

## 2022-10-06 MED ORDER — WEGOVY 1 MG/0.5ML ~~LOC~~ SOAJ
1.0000 mg | SUBCUTANEOUS | 0 refills | Status: DC
  Filled 2022-10-06 – 2022-10-08 (×2): qty 2, 28d supply, fill #0

## 2022-10-08 ENCOUNTER — Other Ambulatory Visit (HOSPITAL_BASED_OUTPATIENT_CLINIC_OR_DEPARTMENT_OTHER): Payer: Self-pay

## 2022-10-12 ENCOUNTER — Other Ambulatory Visit (HOSPITAL_BASED_OUTPATIENT_CLINIC_OR_DEPARTMENT_OTHER): Payer: Self-pay

## 2022-10-15 ENCOUNTER — Other Ambulatory Visit (HOSPITAL_BASED_OUTPATIENT_CLINIC_OR_DEPARTMENT_OTHER): Payer: Self-pay

## 2022-10-15 MED ORDER — WEGOVY 1 MG/0.5ML ~~LOC~~ SOAJ
1.0000 mg | SUBCUTANEOUS | 0 refills | Status: AC
  Filled 2022-10-15: qty 2, 28d supply, fill #0
  Filled ????-??-??: fill #0

## 2022-10-20 ENCOUNTER — Other Ambulatory Visit (HOSPITAL_BASED_OUTPATIENT_CLINIC_OR_DEPARTMENT_OTHER): Payer: Self-pay

## 2022-10-20 MED ORDER — WEGOVY 1.7 MG/0.75ML ~~LOC~~ SOAJ
1.7000 mg | SUBCUTANEOUS | 0 refills | Status: DC
  Filled 2022-10-20 – 2022-11-06 (×2): qty 3, 28d supply, fill #0

## 2022-10-22 ENCOUNTER — Other Ambulatory Visit (HOSPITAL_BASED_OUTPATIENT_CLINIC_OR_DEPARTMENT_OTHER): Payer: Self-pay

## 2022-11-06 ENCOUNTER — Other Ambulatory Visit: Payer: Self-pay

## 2022-11-06 ENCOUNTER — Other Ambulatory Visit (HOSPITAL_BASED_OUTPATIENT_CLINIC_OR_DEPARTMENT_OTHER): Payer: Self-pay

## 2022-11-09 ENCOUNTER — Other Ambulatory Visit (HOSPITAL_BASED_OUTPATIENT_CLINIC_OR_DEPARTMENT_OTHER): Payer: Self-pay

## 2022-11-19 ENCOUNTER — Other Ambulatory Visit (HOSPITAL_BASED_OUTPATIENT_CLINIC_OR_DEPARTMENT_OTHER): Payer: Self-pay

## 2022-11-19 MED ORDER — WEGOVY 1.7 MG/0.75ML ~~LOC~~ SOAJ
1.7000 mg | SUBCUTANEOUS | 0 refills | Status: AC
  Filled 2022-11-19 – 2022-12-07 (×2): qty 3, 28d supply, fill #0

## 2022-11-19 MED ORDER — PHENTERMINE HCL 37.5 MG PO TABS
37.5000 mg | ORAL_TABLET | Freq: Every day | ORAL | 0 refills | Status: AC
  Filled 2022-11-19: qty 30, 30d supply, fill #0

## 2022-12-03 ENCOUNTER — Other Ambulatory Visit (HOSPITAL_BASED_OUTPATIENT_CLINIC_OR_DEPARTMENT_OTHER): Payer: Self-pay

## 2022-12-07 ENCOUNTER — Other Ambulatory Visit (HOSPITAL_BASED_OUTPATIENT_CLINIC_OR_DEPARTMENT_OTHER): Payer: Self-pay

## 2023-01-19 ENCOUNTER — Other Ambulatory Visit: Payer: Self-pay

## 2023-01-19 ENCOUNTER — Encounter (HOSPITAL_BASED_OUTPATIENT_CLINIC_OR_DEPARTMENT_OTHER): Payer: Self-pay | Admitting: Pediatrics

## 2023-01-19 ENCOUNTER — Emergency Department (HOSPITAL_BASED_OUTPATIENT_CLINIC_OR_DEPARTMENT_OTHER)
Admission: EM | Admit: 2023-01-19 | Discharge: 2023-01-19 | Disposition: A | Discharge status: Home / Self Care | Payer: PRIVATE HEALTH INSURANCE | Attending: Emergency Medicine | Admitting: Emergency Medicine

## 2023-01-19 ENCOUNTER — Emergency Department (HOSPITAL_BASED_OUTPATIENT_CLINIC_OR_DEPARTMENT_OTHER): Payer: PRIVATE HEALTH INSURANCE

## 2023-01-19 DIAGNOSIS — B349 Viral infection, unspecified: Secondary | ICD-10-CM | POA: Diagnosis not present

## 2023-01-19 DIAGNOSIS — R5383 Other fatigue: Secondary | ICD-10-CM | POA: Diagnosis present

## 2023-01-19 DIAGNOSIS — R55 Syncope and collapse: Secondary | ICD-10-CM | POA: Insufficient documentation

## 2023-01-19 DIAGNOSIS — Z1152 Encounter for screening for COVID-19: Secondary | ICD-10-CM | POA: Diagnosis not present

## 2023-01-19 LAB — CBC WITH DIFFERENTIAL/PLATELET
Abs Immature Granulocytes: 0 10*3/uL (ref 0.00–0.07)
Basophils Absolute: 0 10*3/uL (ref 0.0–0.1)
Basophils Relative: 1 %
Eosinophils Absolute: 0.1 10*3/uL (ref 0.0–0.5)
Eosinophils Relative: 2 %
HCT: 36 % (ref 36.0–46.0)
Hemoglobin: 11.6 g/dL — ABNORMAL LOW (ref 12.0–15.0)
Immature Granulocytes: 0 %
Lymphocytes Relative: 46 %
Lymphs Abs: 1.9 10*3/uL (ref 0.7–4.0)
MCH: 26.3 pg (ref 26.0–34.0)
MCHC: 32.2 g/dL (ref 30.0–36.0)
MCV: 81.6 fL (ref 80.0–100.0)
Monocytes Absolute: 0.3 10*3/uL (ref 0.1–1.0)
Monocytes Relative: 7 %
Neutro Abs: 1.8 10*3/uL (ref 1.7–7.7)
Neutrophils Relative %: 44 %
Platelets: 289 10*3/uL (ref 150–400)
RBC: 4.41 MIL/uL (ref 3.87–5.11)
RDW: 13.6 % (ref 11.5–15.5)
WBC: 4.1 10*3/uL (ref 4.0–10.5)
nRBC: 0 % (ref 0.0–0.2)

## 2023-01-19 LAB — RESP PANEL BY RT-PCR (RSV, FLU A&B, COVID)  RVPGX2
Influenza A by PCR: NEGATIVE
Influenza B by PCR: NEGATIVE
Resp Syncytial Virus by PCR: NEGATIVE
SARS Coronavirus 2 by RT PCR: NEGATIVE

## 2023-01-19 LAB — COMPREHENSIVE METABOLIC PANEL
ALT: 9 U/L (ref 0–44)
AST: 14 U/L — ABNORMAL LOW (ref 15–41)
Albumin: 4.2 g/dL (ref 3.5–5.0)
Alkaline Phosphatase: 49 U/L (ref 38–126)
Anion gap: 10 (ref 5–15)
BUN: 10 mg/dL (ref 6–20)
CO2: 26 mmol/L (ref 22–32)
Calcium: 9 mg/dL (ref 8.9–10.3)
Chloride: 102 mmol/L (ref 98–111)
Creatinine, Ser: 0.91 mg/dL (ref 0.44–1.00)
GFR, Estimated: >60 mL/min (ref >60)
Glucose, Bld: 96 mg/dL (ref 70–99)
Potassium: 3.8 mmol/L (ref 3.5–5.1)
Sodium: 138 mmol/L (ref 135–145)
Total Bilirubin: 0.4 mg/dL (ref 0.3–1.2)
Total Protein: 7.7 g/dL (ref 6.5–8.1)

## 2023-01-19 NOTE — ED Notes (Signed)
D/c paperwork reviewed with pt, including follow up care.  No questions or concerns voiced at time of d/c. Marland Kitchen Pt verbalized understanding, Ambulatory without assistance to ED exit, NAD.

## 2023-01-19 NOTE — ED Triage Notes (Signed)
C/O shortness of breath started over the weekend on and off along and tingling on right arm, neck pain, tired and low energy. Reports has hx of low iron and some cervical sx.

## 2023-01-19 NOTE — ED Provider Notes (Signed)
[facility] Provider Note   CSN: [id number] Arrival date & time: 01/19/23  1858     History Chief Complaint  Patient presents with   Shortness of Breath    HPI Margaret Roach is a 37 y.o. female presenting for generalized malaise and fatigue. -Near syncope at work.  -Heavier than normal cycles.  Patient's recorded medical, surgical, social, medication list and allergies were reviewed in the Snapshot window as part of the initial history.   Review of Systems   Review of Systems  Constitutional:  Positive for fatigue and fever. Negative for chills.  HENT:  Negative for ear pain and sore throat.   Eyes:  Negative for pain and visual disturbance.  Respiratory:  Negative for cough and shortness of breath.   Cardiovascular:  Negative for chest pain and palpitations.  Gastrointestinal:  Negative for abdominal pain and vomiting.  Genitourinary:  Positive for vaginal bleeding. Negative for dysuria and hematuria.  Musculoskeletal:  Negative for arthralgias and back pain.  Skin:  Negative for color change and rash.  Neurological:  Positive for light-headedness. Negative for seizures and syncope.  All other systems reviewed and are negative.   Physical Exam Updated Vital Signs BP (!) 154/109 (BP Location: Left Arm)   Pulse 72   Temp 97.8 F (36.6 C)   Resp 18   Ht 5\' 3"  (1.6 m)   Wt 67.6 kg   LMP 01/16/2023   SpO2 100%   BMI 26.39 kg/m  Physical Exam Vitals and nursing note reviewed.  Constitutional:      General: She is not in acute distress.    Appearance: She is well-developed.  HENT:     Head: Normocephalic and atraumatic.  Eyes:     Conjunctiva/sclera: Conjunctivae normal.  Cardiovascular:     Rate and Rhythm: Normal rate and regular rhythm.     Heart sounds: No murmur heard. Pulmonary:     Effort: Pulmonary effort is normal. No respiratory distress.     Breath sounds: Normal breath sounds.  Abdominal:      General: There is no distension.     Palpations: Abdomen is soft.     Tenderness: There is no abdominal tenderness. There is no right CVA tenderness or left CVA tenderness.  Musculoskeletal:        General: No swelling or tenderness. Normal range of motion.     Cervical back: Neck supple.  Skin:    General: Skin is warm and dry.  Neurological:     General: No focal deficit present.     Mental Status: She is alert and oriented to person, place, and time. Mental status is at baseline.     Cranial Nerves: No cranial nerve deficit.      ED Course/ Medical Decision Making/ A&P Clinical Course as of 01/19/23 2048  Tue Jan 19, 2023  2038 Fatigue and malaise x 5 days Lightheaded and weak especially yesterday  [CC]    Clinical Course User Index [CC] Margaret Ade, MD    Procedures Procedures   Medications Ordered in ED Medications - No data to display Medical Decision Making:   [name] is a 37 y.o. female who presented to the ED today with subjective fever, cough, congestion detailed above.    Patient placed on continuous vitals and telemetry monitoring while in ED which was reviewed periodically.   Complete initial physical exam performed, notably the patient  was hemodynamically stable in no acute distress.  Posterior  oropharynx illuminated and without obvious swelling or deformity.  Patient is without neck stiffness.    Reviewed and confirmed nursing documentation for past medical history, family history, social history.    Initial Assessment:   With the patient's presentation of fever cough congestion, most likely diagnosis is developing viral upper respiratory infection. Other diagnoses were considered including (but not limited to) peritonsillar abscess, retropharyngeal abscess, pneumonia. These are considered less likely due to history of present illness and physical exam findings.   This is most consistent with an acute life/limb threatening illness complicated  by underlying chronic conditions. Considered meningitis, however patient's symptoms, vital signs, physical exam findings including lack of meningismus seem grossly less consistent at this time. Initial Plan:  Screening labs including CBC and Metabolic panel to evaluate for infectious or metabolic etiology of disease.  Viral screening including COVID/flu testing to evaluate for common viral etiologies that need to be tracked CXR to evaluate for structural/infectious intrathoracic pathology.  Empiric treatment with antipyretics including acetaminophen in ambulatory setting Objective evaluation as below reviewed   Initial Study Results:   Laboratory  All laboratory results reviewed without evidence of clinically relevant pathology.    Radiology:  All images reviewed independently. Agree with radiology report at this time.   DG Chest 2 View  Final Result         Final Assessment and Plan:   On reassessment, patient is ambulatory tolerating p.o. intake in no acute distress.   Patient's COVID test is pending but can be followed up asynchronously.  Patient is currently stable for outpatient care and management with no indication for hospitalization or transfer at this time.  Discussed all findings with patient expressed understanding.  Disposition:  Based on the above findings, I believe patient is stable for discharge.    Patient/family educated about specific return precautions for given chief complaint and symptoms.  Patient/family educated about follow-up with PCP.     Patient/family expressed understanding of return precautions and need for follow-up. Patient spoken to regarding all imaging and laboratory results and appropriate follow up for these results. All education provided in verbal form with additional information in written form. Time was allowed for answering of patient questions. Patient discharged.    Emergency Department Medication Summary:   Medications - No data to  display        Clinical Impression:  1. Viral syndrome      Discharge   Final Clinical Impression(s) / ED Diagnoses Final diagnoses:  Viral syndrome    Rx / DC Orders ED Discharge Orders     None         Margaret Ade, MD 01/19/23 2049
